# Patient Record
Sex: Female | Born: 1935 | Race: White | Hispanic: No | Smoking: Never smoker
Health system: Southern US, Community
[De-identification: ages and names within clinical notes are randomized; demographics above are authoritative.]

## PROBLEM LIST (undated history)

## (undated) DIAGNOSIS — I480 Paroxysmal atrial fibrillation: Secondary | ICD-10-CM

## (undated) DIAGNOSIS — I359 Nonrheumatic aortic valve disorder, unspecified: Secondary | ICD-10-CM

## (undated) DIAGNOSIS — H269 Unspecified cataract: Secondary | ICD-10-CM

## (undated) DIAGNOSIS — Z87828 Personal history of other (healed) physical injury and trauma: Secondary | ICD-10-CM

## (undated) DIAGNOSIS — H409 Unspecified glaucoma: Secondary | ICD-10-CM

## (undated) DIAGNOSIS — E119 Type 2 diabetes mellitus without complications: Secondary | ICD-10-CM

---

## 1998-08-31 ENCOUNTER — Encounter: Payer: Self-pay | Admitting: Emergency Medicine

## 1998-08-31 ENCOUNTER — Emergency Department (HOSPITAL_COMMUNITY): Admission: EM | Admit: 1998-08-31 | Discharge: 1998-08-31 | Payer: Self-pay | Admitting: Emergency Medicine

## 2001-05-22 ENCOUNTER — Encounter: Payer: Self-pay | Admitting: Internal Medicine

## 2001-05-22 ENCOUNTER — Ambulatory Visit (HOSPITAL_COMMUNITY): Admission: RE | Admit: 2001-05-22 | Discharge: 2001-05-22 | Payer: Self-pay | Admitting: Internal Medicine

## 2002-01-01 ENCOUNTER — Ambulatory Visit (HOSPITAL_COMMUNITY): Admission: RE | Admit: 2002-01-01 | Discharge: 2002-01-01 | Payer: Self-pay | Admitting: Internal Medicine

## 2002-01-01 ENCOUNTER — Encounter: Payer: Self-pay | Admitting: Internal Medicine

## 2002-01-05 ENCOUNTER — Encounter: Payer: Self-pay | Admitting: Internal Medicine

## 2002-01-05 ENCOUNTER — Ambulatory Visit (HOSPITAL_COMMUNITY): Admission: RE | Admit: 2002-01-05 | Discharge: 2002-01-05 | Payer: Self-pay | Admitting: Internal Medicine

## 2002-03-25 ENCOUNTER — Inpatient Hospital Stay (HOSPITAL_COMMUNITY): Admission: EM | Admit: 2002-03-25 | Discharge: 2002-03-26 | Payer: Self-pay | Admitting: Emergency Medicine

## 2002-03-25 ENCOUNTER — Encounter: Payer: Self-pay | Admitting: Emergency Medicine

## 2002-07-13 ENCOUNTER — Emergency Department (HOSPITAL_COMMUNITY): Admission: EM | Admit: 2002-07-13 | Discharge: 2002-07-13 | Payer: Self-pay | Admitting: Emergency Medicine

## 2002-07-13 ENCOUNTER — Encounter: Payer: Self-pay | Admitting: Emergency Medicine

## 2002-10-05 ENCOUNTER — Encounter: Payer: Self-pay | Admitting: Internal Medicine

## 2002-10-05 ENCOUNTER — Ambulatory Visit (HOSPITAL_COMMUNITY): Admission: RE | Admit: 2002-10-05 | Discharge: 2002-10-05 | Payer: Self-pay | Admitting: Internal Medicine

## 2002-10-14 ENCOUNTER — Emergency Department (HOSPITAL_COMMUNITY): Admission: EM | Admit: 2002-10-14 | Discharge: 2002-10-14 | Payer: Self-pay | Admitting: Emergency Medicine

## 2002-10-14 ENCOUNTER — Encounter: Payer: Self-pay | Admitting: *Deleted

## 2003-08-19 ENCOUNTER — Ambulatory Visit (HOSPITAL_COMMUNITY): Admission: RE | Admit: 2003-08-19 | Discharge: 2003-08-19 | Payer: Self-pay | Admitting: Family Medicine

## 2003-11-29 ENCOUNTER — Ambulatory Visit (HOSPITAL_COMMUNITY): Admission: RE | Admit: 2003-11-29 | Discharge: 2003-11-29 | Payer: Self-pay | Admitting: Pediatrics

## 2004-07-25 ENCOUNTER — Emergency Department (HOSPITAL_COMMUNITY): Admission: EM | Admit: 2004-07-25 | Discharge: 2004-07-25 | Payer: Self-pay | Admitting: Emergency Medicine

## 2004-11-08 ENCOUNTER — Ambulatory Visit (HOSPITAL_COMMUNITY): Admission: RE | Admit: 2004-11-08 | Discharge: 2004-11-08 | Payer: Self-pay | Admitting: Family Medicine

## 2011-04-02 DIAGNOSIS — Z87828 Personal history of other (healed) physical injury and trauma: Secondary | ICD-10-CM

## 2011-04-02 HISTORY — DX: Personal history of other (healed) physical injury and trauma: Z87.828

## 2011-07-14 ENCOUNTER — Encounter (HOSPITAL_COMMUNITY): Payer: Self-pay | Admitting: *Deleted

## 2011-07-14 ENCOUNTER — Emergency Department (HOSPITAL_COMMUNITY)
Admission: EM | Admit: 2011-07-14 | Discharge: 2011-07-14 | Disposition: A | Discharge status: Home / Self Care | Payer: Medicare Other | Attending: Emergency Medicine | Admitting: Emergency Medicine

## 2011-07-14 ENCOUNTER — Emergency Department (HOSPITAL_COMMUNITY): Payer: Medicare Other

## 2011-07-14 DIAGNOSIS — R002 Palpitations: Secondary | ICD-10-CM | POA: Insufficient documentation

## 2011-07-14 DIAGNOSIS — I10 Essential (primary) hypertension: Secondary | ICD-10-CM | POA: Insufficient documentation

## 2011-07-14 DIAGNOSIS — E119 Type 2 diabetes mellitus without complications: Secondary | ICD-10-CM | POA: Insufficient documentation

## 2011-07-14 DIAGNOSIS — I4891 Unspecified atrial fibrillation: Secondary | ICD-10-CM | POA: Insufficient documentation

## 2011-07-14 DIAGNOSIS — I480 Paroxysmal atrial fibrillation: Secondary | ICD-10-CM

## 2011-07-14 DIAGNOSIS — Z7901 Long term (current) use of anticoagulants: Secondary | ICD-10-CM | POA: Insufficient documentation

## 2011-07-14 DIAGNOSIS — R000 Tachycardia, unspecified: Secondary | ICD-10-CM | POA: Insufficient documentation

## 2011-07-14 DIAGNOSIS — Z79899 Other long term (current) drug therapy: Secondary | ICD-10-CM | POA: Insufficient documentation

## 2011-07-14 LAB — POCT I-STAT, CHEM 8
Chloride: 103 mEq/L (ref 96–112)
Creatinine, Ser: 0.9 mg/dL (ref 0.50–1.10)
Potassium: 4.1 mEq/L (ref 3.5–5.1)
Sodium: 137 mEq/L (ref 135–145)

## 2011-07-14 LAB — PROTIME-INR
INR: 1.53 — ABNORMAL HIGH (ref 0.00–1.49)
Prothrombin Time: 18.7 seconds — ABNORMAL HIGH (ref 11.6–15.2)

## 2011-07-14 LAB — URINALYSIS, ROUTINE W REFLEX MICROSCOPIC
Leukocytes, UA: NEGATIVE
Nitrite: NEGATIVE
Protein, ur: NEGATIVE mg/dL
Specific Gravity, Urine: <1.005 — ABNORMAL LOW (ref 1.005–1.030)
Urobilinogen, UA: 0.2 mg/dL (ref 0.0–1.0)

## 2011-07-14 LAB — CBC
HCT: 27.9 % — ABNORMAL LOW (ref 36.0–46.0)
Platelets: 109 10*3/uL — ABNORMAL LOW (ref 150–400)
RDW: 14.7 % (ref 11.5–15.5)

## 2011-07-14 LAB — POCT I-STAT TROPONIN I: Troponin i, poc: 0 ng/mL (ref 0.00–0.08)

## 2011-07-14 NOTE — ED Provider Notes (Signed)
History     CSN: [id number]  Arrival date & time 07/14/11  0056   First MD Initiated Contact with Patient 07/14/11 0123      Chief Complaint  Patient presents with  . Tachycardia    (Consider location/radiation/quality/duration/timing/severity/associated sxs/prior treatment) HPI History provided by patient. Chief complaint is  palpitations. Patient had gone to bed tonight and woke up with fluttering sensation in her chest. She has history of intermittent atrial fibrillation and has been instructed by her cardiologist to take an extra Lopressor if this occurs. She checked her heart rate noted to be in the range of 130-150.  She took an extra Lopressor and presented to the emergency apartment. She denies any chest pain or shortness of breath. No recent illness. She's been in normal state of health. She currently denies any symptoms.  states it feels like her fluttering has resolved. Moderate in severity. No known aggravating factors.   Past Medical History  Diagnosis Date  . Diabetes mellitus   . Atrial fibrillation   . Hypertension     History reviewed. No pertinent past surgical history.  History reviewed. No pertinent family history.  History  Substance Use Topics  . Smoking status: Never Smoker   . Smokeless tobacco: Not on file  . Alcohol Use: No    OB History    Grav Para Term Preterm Abortions TAB SAB Ect Mult Living                  Review of Systems  Constitutional: Negative for fever and chills.  HENT: Negative for neck pain and neck stiffness.   Eyes: Negative for pain.  Respiratory: Negative for shortness of breath.   Cardiovascular: Positive for palpitations. Negative for chest pain and leg swelling.  Gastrointestinal: Negative for abdominal pain.  Genitourinary: Negative for dysuria.  Musculoskeletal: Negative for back pain.  Skin: Negative for rash.  Neurological: Negative for headaches.  All other systems reviewed and are negative.    Allergies    Codeine and Sulfa antibiotics  Home Medications   Current Outpatient Rx  Name Route Sig Dispense Refill  . GLIMEPIRIDE 2 MG PO TABS Oral Take 2 mg by mouth daily before breakfast.    . LINAGLIPTIN 5 MG PO TABS Oral Take 5 mg by mouth daily.    Marland Kitchen LISINOPRIL-HYDROCHLOROTHIAZIDE 20-12.5 MG PO TABS Oral Take 1 tablet by mouth daily.    Marland Kitchen METFORMIN HCL 500 MG PO TABS Oral Take 500 mg by mouth 2 (two) times daily with a meal.    . METOPROLOL TARTRATE 25 MG PO TABS Oral Take 25 mg by mouth 2 (two) times daily.    . WARFARIN SODIUM 5 MG PO TABS Oral Take 5 mg by mouth daily.      BP 173/63  Pulse 81  Temp 98.2 F (36.8 C)  Resp 20  Ht 5\' 5"  (1.651 m)  Wt 215 lb (97.523 kg)  BMI 35.78 kg/m2  SpO2 100%  Physical Exam  Constitutional: She is oriented to person, place, and time. She appears well-developed and well-nourished.  HENT:  Head: Normocephalic and atraumatic.  Eyes: Conjunctivae and EOM are normal. Pupils are equal, round, and reactive to light.  Neck: Trachea normal. Neck supple. No thyromegaly present.  Cardiovascular: Normal rate, regular rhythm, S1 normal, S2 normal and normal pulses.     No systolic murmur is present   No diastolic murmur is present  Pulses:      Radial pulses are 2+ on the right  side, and 2+ on the left side.  Pulmonary/Chest: Effort normal and breath sounds normal. She has no wheezes. She has no rhonchi. She has no rales. She exhibits no tenderness.  Abdominal: Soft. Normal appearance and bowel sounds are normal. There is no tenderness. There is no CVA tenderness and negative Murphy's sign.  Musculoskeletal:       BLE:s Calves nontender, no cords or erythema, negative Homans sign  Neurological: She is alert and oriented to person, place, and time. She has normal strength. No cranial nerve deficit or sensory deficit. GCS eye subscore is 4. GCS verbal subscore is 5. GCS motor subscore is 6.  Skin: Skin is warm and dry. No rash noted. She is not  diaphoretic.  Psychiatric: Her speech is normal.       Cooperative and appropriate    ED Course  Procedures (including critical care time)  Labs Reviewed  CBC - Abnormal; Notable for the following:    WBC 3.4 (*)    RBC 3.33 (*)    Hemoglobin 9.0 (*)    HCT 27.9 (*)    Platelets 109 (*)    All other components within normal limits  PROTIME-INR - Abnormal; Notable for the following:    Prothrombin Time 18.7 (*)    INR 1.53 (*)    All other components within normal limits  POCT I-STAT, CHEM 8 - Abnormal; Notable for the following:    Glucose, Bld 203 (*)    Hemoglobin 9.2 (*)    HCT 27.0 (*)    All other components within normal limits  POCT I-STAT TROPONIN I  URINALYSIS, ROUTINE W REFLEX MICROSCOPIC    Date: 07/14/2011  Rate: 79  Rhythm: normal sinus rhythm  QRS Axis: normal  Intervals: QT prolonged  ST/T Wave abnormalities: nonspecific ST changes  Conduction Disutrbances: incomplete right bundle-branch block  Narrative Interpretation:   Old EKG Reviewed: none available  Asymptomatic in the ED. Normal sinus rhythm. Presentation suggest she was in atrial fibrillation prior to arrival. History of paroxysmal A. fib  Recheck at 4 AM is resting comfortably and remains without complaints. Stable for discharge home MDM   Palpitations with stated history of A. fib on Coumadin. Placed on monitor - is currently in normal sinus.   Mild anemia - is also discussed with patient and she states that she was recently told that she is mildly anemic by her Dr. Ivor Reining what her hemoglobin was. No black or tarry stools or active bleeding. No symptoms of anemia. Plan close followup for the same  INR 1.53 - subtherapeutic. Plan close primary care followup for medication management.I discussed this with patient and she tells me that her physician prefers her INR to be around 1.5.   Patient is no longer followed by cardiology, plan followup with Dr. Delbert Harness, primary care physician. Patient  stable for discharge home without indication for admit for further workup at this time.      Sunnie Nielsen, MD 07/14/11 [id number]

## 2011-07-14 NOTE — Discharge Instructions (Signed)
Atrial Fibrillation Your caregiver has diagnosed you with atrial fibrillation (AFib). The heart normally beats very regularly; AFib is a type of irregular heartbeat. The heart rate may be faster or slower than normal. This can prevent your heart from pumping as well as it should. AFib can be constant (chronic) or intermittent (paroxysmal). CAUSES  Atrial fibrillation may be caused by:  Heart disease, including heart attack, coronary artery disease, heart failure, diseases of the heart valves, and others.   Blood clot in the lungs (pulmonary embolism).   Pneumonia or other infections.   Chronic lung disease.   Thyroid disease.   Toxins. These include alcohol, some medications (such as decongestant medications or diet pills), and caffeine.  In some people, no cause for AFib can be found. This is referred to as Lone Atrial Fibrillation. SYMPTOMS   Palpitations or a fluttering in your chest.   A vague sense of chest discomfort.   Shortness of breath.   Sudden onset of lightheadedness or weakness.  Sometimes, the first sign of AFib can be a complication of the condition. This could be a stroke or heart failure. DIAGNOSIS  Your description of your condition may make your caregiver suspicious of atrial fibrillation. Your caregiver will examine your pulse to determine if fibrillation is present. An EKG (electrocardiogram) will confirm the diagnosis. Further testing may help determine what caused you to have atrial fibrillation. This may include chest x-ray, echocardiogram, blood tests, or CT scans. PREVENTION  If you have previously had atrial fibrillation, your caregiver may advise you to avoid substances known to cause the condition (such as stimulant medications, and possibly caffeine or alcohol). You may be advised to use medications to prevent recurrence. Proper treatment of any underlying condition is important to help prevent recurrence. PROGNOSIS  Atrial fibrillation does tend to  become a chronic condition over time. It can cause significant complications (see below). Atrial fibrillation is not usually immediately life-threatening, but it can shorten your life expectancy. This seems to be worse in women. If you have lone atrial fibrillation and are under 60 years old, the risk of complications is very low, and life expectancy is not shortened. RISKS AND COMPLICATIONS  Complications of atrial fibrillation can include stroke, chest pain, and heart failure. Your caregiver will recommend treatments for the atrial fibrillation, as well as for any underlying conditions, to help minimize risk of complications. TREATMENT  Treatment for AFib is divided into several categories:  Treatment of any underlying condition.   Converting you out of AFib into a regular (sinus) rhythm.   Controlling rapid heart rate.   Prevention of blood clots and stroke.  Medications and procedures are available to convert your atrial fibrillation to sinus rhythm. However, recent studies have shown that this may not offer you any advantage, and cardiac experts are continuing research and debate on this topic. More important is controlling your rapid heartbeat. The rapid heartbeat causes more symptoms, and places strain on your heart. Your caregiver will advise you on the use of medications that can control your heart rate. Atrial fibrillation is a strong stroke risk. You can lessen this risk by taking blood thinning medications such as Coumadin (warfarin), or sometimes aspirin. These medications need close monitoring by your caregiver. Over-medication can cause bleeding. Too little medication may not protect against stroke. HOME CARE INSTRUCTIONS   If your caregiver prescribed medicine to make your heartbeat more normally, take as directed.   If blood thinners were prescribed by your caregiver, take EXACTLY as directed.     Perform blood tests EXACTLY as directed.   Quit smoking. Smoking increases your  cardiac and lung (pulmonary) risks.   DO NOT drink alcohol.   DO NOT drink caffeinated drinks (e.g. coffee, soda, chocolate, and leaf teas). You may drink decaffeinated coffee, soda or tea.   If you are overweight, you should choose a reduced calorie diet to lose weight. Please see a registered dietitian if you need more information about healthy weight loss. DO NOT USE DIET PILLS as they may aggravate heart problems.   If you have other heart problems that are causing AFib, you may need to eat a low salt, fat, and cholesterol diet. Your caregiver will tell you if this is necessary.   Exercise every day to improve your physical fitness. Stay active unless advised otherwise.   If your caregiver has given you a follow-up appointment, it is very important to keep that appointment. Not keeping the appointment could result in heart failure or stroke. If there is any problem keeping the appointment, you must call back to this facility for assistance.  SEEK MEDICAL CARE IF:  You notice a change in the rate, rhythm or strength of your heartbeat.   You develop an infection or any other change in your overall health status.  SEEK IMMEDIATE MEDICAL CARE IF:   You develop chest pain, abdominal pain, sweating, weakness or feel sick to your stomach (nausea).   You develop shortness of breath.   You develop swollen feet and ankles.   You develop dizziness, numbness, or weakness of your face or limbs, or any change in vision or speech.  MAKE SURE YOU:   Understand these instructions.   Will watch your condition.   Will get help right away if you are not doing well or get worse.    Continue your medications as prescribed. Return here for any worsening condition.

## 2011-07-14 NOTE — ED Notes (Addendum)
Pt reports being awoken up about an hour ago with rapid heart rate.  States that reports she checked, rate ranged 130-150.  Pt reports she did take an extra dose of Metoprolol.  Presently rate controlled and regular, but more rapid than normal.  Pt does report feeling light headed and weak at present.

## 2011-08-02 ENCOUNTER — Ambulatory Visit (HOSPITAL_COMMUNITY)
Admission: RE | Admit: 2011-08-02 | Discharge: 2011-08-02 | Disposition: A | Discharge status: Home / Self Care | Payer: Medicare Other | Source: Ambulatory Visit | Attending: Family Medicine | Admitting: Family Medicine

## 2011-08-02 ENCOUNTER — Other Ambulatory Visit (HOSPITAL_COMMUNITY): Payer: Self-pay | Admitting: Family Medicine

## 2011-08-02 DIAGNOSIS — R29890 Loss of height: Secondary | ICD-10-CM | POA: Insufficient documentation

## 2011-08-02 DIAGNOSIS — M1990 Unspecified osteoarthritis, unspecified site: Secondary | ICD-10-CM

## 2011-08-02 DIAGNOSIS — R5381 Other malaise: Secondary | ICD-10-CM | POA: Insufficient documentation

## 2011-08-02 DIAGNOSIS — M545 Low back pain, unspecified: Secondary | ICD-10-CM | POA: Insufficient documentation

## 2011-12-13 ENCOUNTER — Emergency Department (HOSPITAL_COMMUNITY): Payer: No Typology Code available for payment source

## 2011-12-13 ENCOUNTER — Inpatient Hospital Stay (HOSPITAL_COMMUNITY)
Admission: EM | Admit: 2011-12-13 | Discharge: 2011-12-20 | DRG: 394 | Disposition: A | Discharge status: Home / Self Care | Payer: No Typology Code available for payment source | Attending: General Surgery | Admitting: General Surgery

## 2011-12-13 ENCOUNTER — Encounter (HOSPITAL_COMMUNITY): Payer: Self-pay

## 2011-12-13 DIAGNOSIS — S2249XA Multiple fractures of ribs, unspecified side, initial encounter for closed fracture: Secondary | ICD-10-CM | POA: Diagnosis present

## 2011-12-13 DIAGNOSIS — S36509A Unspecified injury of unspecified part of colon, initial encounter: Secondary | ICD-10-CM

## 2011-12-13 DIAGNOSIS — D649 Anemia, unspecified: Secondary | ICD-10-CM | POA: Diagnosis present

## 2011-12-13 DIAGNOSIS — S40019A Contusion of unspecified shoulder, initial encounter: Secondary | ICD-10-CM | POA: Diagnosis present

## 2011-12-13 DIAGNOSIS — Z880 Allergy status to penicillin: Secondary | ICD-10-CM

## 2011-12-13 DIAGNOSIS — Y998 Other external cause status: Secondary | ICD-10-CM

## 2011-12-13 DIAGNOSIS — I10 Essential (primary) hypertension: Secondary | ICD-10-CM | POA: Diagnosis present

## 2011-12-13 DIAGNOSIS — S8000XA Contusion of unspecified knee, initial encounter: Secondary | ICD-10-CM | POA: Diagnosis present

## 2011-12-13 DIAGNOSIS — S36500A Unspecified injury of ascending [right] colon, initial encounter: Principal | ICD-10-CM | POA: Diagnosis present

## 2011-12-13 DIAGNOSIS — I4891 Unspecified atrial fibrillation: Secondary | ICD-10-CM | POA: Diagnosis present

## 2011-12-13 DIAGNOSIS — Y92410 Unspecified street and highway as the place of occurrence of the external cause: Secondary | ICD-10-CM

## 2011-12-13 DIAGNOSIS — S2241XA Multiple fractures of ribs, right side, initial encounter for closed fracture: Secondary | ICD-10-CM

## 2011-12-13 DIAGNOSIS — Z7901 Long term (current) use of anticoagulants: Secondary | ICD-10-CM

## 2011-12-13 DIAGNOSIS — S301XXA Contusion of abdominal wall, initial encounter: Secondary | ICD-10-CM

## 2011-12-13 DIAGNOSIS — S36520A Contusion of ascending [right] colon, initial encounter: Secondary | ICD-10-CM | POA: Diagnosis present

## 2011-12-13 DIAGNOSIS — T07XXXA Unspecified multiple injuries, initial encounter: Secondary | ICD-10-CM | POA: Diagnosis present

## 2011-12-13 DIAGNOSIS — S20219A Contusion of unspecified front wall of thorax, initial encounter: Secondary | ICD-10-CM

## 2011-12-13 DIAGNOSIS — E119 Type 2 diabetes mellitus without complications: Secondary | ICD-10-CM | POA: Diagnosis present

## 2011-12-13 DIAGNOSIS — Z882 Allergy status to sulfonamides status: Secondary | ICD-10-CM

## 2011-12-13 LAB — CBC WITH DIFFERENTIAL/PLATELET
Basophils Relative: 0 % (ref 0–1)
Eosinophils Absolute: 0 10*3/uL (ref 0.0–0.7)
HCT: 28.4 % — ABNORMAL LOW (ref 36.0–46.0)
Hemoglobin: 9.7 g/dL — ABNORMAL LOW (ref 12.0–15.0)
MCH: 30.9 pg (ref 26.0–34.0)
MCHC: 34.2 g/dL (ref 30.0–36.0)
Monocytes Absolute: 0.7 10*3/uL (ref 0.1–1.0)
Neutro Abs: 8.9 10*3/uL — ABNORMAL HIGH (ref 1.7–7.7)

## 2011-12-13 LAB — COMPREHENSIVE METABOLIC PANEL
BUN: 15 mg/dL (ref 6–23)
Calcium: 8.8 mg/dL (ref 8.4–10.5)
GFR calc Af Amer: 79 mL/min — ABNORMAL LOW (ref >90)
Glucose, Bld: 199 mg/dL — ABNORMAL HIGH (ref 70–99)
Total Protein: 5.7 g/dL — ABNORMAL LOW (ref 6.0–8.3)

## 2011-12-13 LAB — GLUCOSE, CAPILLARY: Glucose-Capillary: 182 mg/dL — ABNORMAL HIGH (ref 70–99)

## 2011-12-13 LAB — POCT I-STAT, CHEM 8
Glucose, Bld: 183 mg/dL — ABNORMAL HIGH (ref 70–99)
HCT: 30 % — ABNORMAL LOW (ref 36.0–46.0)
Hemoglobin: 10.2 g/dL — ABNORMAL LOW (ref 12.0–15.0)
Potassium: 3.5 mEq/L (ref 3.5–5.1)
TCO2: 23 mmol/L (ref 0–100)

## 2011-12-13 MED ORDER — GLIMEPIRIDE 2 MG PO TABS
2.0000 mg | ORAL_TABLET | Freq: Every day | ORAL | Status: DC
  Administered 2011-12-14: 2 mg via ORAL
  Filled 2011-12-13 (×2): qty 1

## 2011-12-13 MED ORDER — ONDANSETRON HCL 4 MG/2ML IJ SOLN: 4.0000 mg | Freq: Four times a day (QID) | INTRAMUSCULAR | Status: DC | PRN

## 2011-12-13 MED ORDER — LISINOPRIL-HYDROCHLOROTHIAZIDE 20-12.5 MG PO TABS
1.0000 | ORAL_TABLET | Freq: Every day | ORAL | Status: DC
Start: 2011-12-14 — End: 2011-12-14

## 2011-12-13 MED ORDER — ACETAMINOPHEN 325 MG PO TABS: 650.0000 mg | ORAL_TABLET | ORAL | Status: DC | PRN

## 2011-12-13 MED ORDER — FENTANYL CITRATE 0.05 MG/ML IJ SOLN
50.0000 ug | Freq: Once | INTRAMUSCULAR | Status: AC
  Administered 2011-12-13: 50 ug via INTRAVENOUS
  Filled 2011-12-13: qty 2

## 2011-12-13 MED ORDER — METFORMIN HCL 500 MG PO TABS
500.0000 mg | ORAL_TABLET | Freq: Two times a day (BID) | ORAL | Status: DC
  Filled 2011-12-13 (×3): qty 1

## 2011-12-13 MED ORDER — INSULIN ASPART 100 UNIT/ML ~~LOC~~ SOLN: 0.0000 [IU] | Freq: Every day | SUBCUTANEOUS | Status: DC

## 2011-12-13 MED ORDER — VITAMIN D3 25 MCG (1000 UNIT) PO TABS
1000.0000 [IU] | ORAL_TABLET | Freq: Every day | ORAL | Status: DC
  Administered 2011-12-14 – 2011-12-20 (×7): 1000 [IU] via ORAL
  Filled 2011-12-13 (×7): qty 1

## 2011-12-13 MED ORDER — HYDROMORPHONE HCL 2 MG PO TABS
2.0000 mg | ORAL_TABLET | ORAL | Status: DC | PRN
  Administered 2011-12-17: 2 mg via ORAL
  Filled 2011-12-13: qty 1

## 2011-12-13 MED ORDER — IPRATROPIUM-ALBUTEROL 18-103 MCG/ACT IN AERO
2.0000 | INHALATION_SPRAY | Freq: Four times a day (QID) | RESPIRATORY_TRACT | Status: DC
  Filled 2011-12-13: qty 14.7

## 2011-12-13 MED ORDER — INSULIN ASPART 100 UNIT/ML ~~LOC~~ SOLN
0.0000 [IU] | Freq: Three times a day (TID) | SUBCUTANEOUS | Status: DC
  Administered 2011-12-14: 3 [IU] via SUBCUTANEOUS
  Administered 2011-12-14: 2 [IU] via SUBCUTANEOUS
  Administered 2011-12-14: 3 [IU] via SUBCUTANEOUS

## 2011-12-13 MED ORDER — SODIUM CHLORIDE 0.9 % IV SOLN
INTRAVENOUS | Status: DC
  Administered 2011-12-13: 14:00:00 via INTRAVENOUS

## 2011-12-13 MED ORDER — PANTOPRAZOLE SODIUM 40 MG PO TBEC
40.0000 mg | DELAYED_RELEASE_TABLET | Freq: Every day | ORAL | Status: DC
  Administered 2011-12-14 – 2011-12-20 (×7): 40 mg via ORAL
  Filled 2011-12-13 (×7): qty 1

## 2011-12-13 MED ORDER — PANTOPRAZOLE SODIUM 40 MG IV SOLR
40.0000 mg | Freq: Every day | INTRAVENOUS | Status: DC
  Filled 2011-12-13 (×7): qty 40

## 2011-12-13 MED ORDER — ONDANSETRON HCL 4 MG/2ML IJ SOLN
4.0000 mg | Freq: Once | INTRAMUSCULAR | Status: AC
  Administered 2011-12-13: 4 mg via INTRAVENOUS
  Filled 2011-12-13: qty 2

## 2011-12-13 MED ORDER — METOPROLOL TARTRATE 25 MG PO TABS
25.0000 mg | ORAL_TABLET | Freq: Two times a day (BID) | ORAL | Status: DC
  Administered 2011-12-14 – 2011-12-20 (×14): 25 mg via ORAL
  Filled 2011-12-13 (×16): qty 1

## 2011-12-13 MED ORDER — IOHEXOL 300 MG/ML  SOLN
100.0000 mL | Freq: Once | INTRAMUSCULAR | Status: AC | PRN
  Administered 2011-12-13: 100 mL via INTRAVENOUS

## 2011-12-13 MED ORDER — MORPHINE SULFATE 2 MG/ML IJ SOLN
2.0000 mg | INTRAMUSCULAR | Status: DC | PRN
  Administered 2011-12-14 – 2011-12-16 (×9): 2 mg via INTRAVENOUS
  Filled 2011-12-13 (×10): qty 1

## 2011-12-13 MED ORDER — ONDANSETRON HCL 4 MG PO TABS: 4.0000 mg | ORAL_TABLET | Freq: Four times a day (QID) | ORAL | Status: DC | PRN

## 2011-12-13 MED ORDER — LINAGLIPTIN 5 MG PO TABS
5.0000 mg | ORAL_TABLET | Freq: Every day | ORAL | Status: DC
  Administered 2011-12-14 – 2011-12-20 (×7): 5 mg via ORAL
  Filled 2011-12-13 (×7): qty 1

## 2011-12-13 MED ORDER — POTASSIUM CHLORIDE IN NACL 20-0.45 MEQ/L-% IV SOLN
INTRAVENOUS | Status: DC
  Administered 2011-12-14: 1000 mL via INTRAVENOUS
  Administered 2011-12-14 – 2011-12-19 (×7): via INTRAVENOUS
  Filled 2011-12-13 (×13): qty 1000

## 2011-12-13 MED ORDER — SODIUM CHLORIDE 0.9 % IV BOLUS (SEPSIS)
500.0000 mL | Freq: Once | INTRAVENOUS | Status: AC
  Administered 2011-12-13: 500 mL via INTRAVENOUS

## 2011-12-13 NOTE — ED Notes (Signed)
Per [facility] link pt is 4th in line for transport. Family and pt made aware.
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
## 2011-12-13 NOTE — ED Notes (Signed)
Pt c/o feeling nauseated. Pt states, " I think Im going to throw up" medicated for nausea- see MAR

## 2011-12-13 NOTE — ED Notes (Signed)
Dr. Adriana Simas in and made family and pt aware of being transferred to [facility]. Pt and family agree to transfer. Pt resting in bed- nad noted. Waiting for [facility] to transfer pt to [facility].
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
## 2011-12-14 ENCOUNTER — Inpatient Hospital Stay (HOSPITAL_COMMUNITY): Payer: No Typology Code available for payment source

## 2011-12-14 DIAGNOSIS — S2241XA Multiple fractures of ribs, right side, initial encounter for closed fracture: Secondary | ICD-10-CM | POA: Diagnosis present

## 2011-12-14 DIAGNOSIS — S36520A Contusion of ascending [right] colon, initial encounter: Secondary | ICD-10-CM | POA: Diagnosis present

## 2011-12-14 DIAGNOSIS — Z7901 Long term (current) use of anticoagulants: Secondary | ICD-10-CM

## 2011-12-14 DIAGNOSIS — T07XXXA Unspecified multiple injuries, initial encounter: Secondary | ICD-10-CM | POA: Diagnosis present

## 2011-12-14 DIAGNOSIS — E119 Type 2 diabetes mellitus without complications: Secondary | ICD-10-CM | POA: Diagnosis present

## 2011-12-14 DIAGNOSIS — D649 Anemia, unspecified: Secondary | ICD-10-CM | POA: Diagnosis present

## 2011-12-14 DIAGNOSIS — I4891 Unspecified atrial fibrillation: Secondary | ICD-10-CM | POA: Diagnosis present

## 2011-12-14 LAB — CBC
MCH: 30.4 pg (ref 26.0–34.0)
Platelets: 113 10*3/uL — ABNORMAL LOW (ref 150–400)
RBC: 2.8 MIL/uL — ABNORMAL LOW (ref 3.87–5.11)
WBC: 6.3 10*3/uL (ref 4.0–10.5)

## 2011-12-14 LAB — URINALYSIS, ROUTINE W REFLEX MICROSCOPIC
Ketones, ur: NEGATIVE mg/dL
Nitrite: NEGATIVE
Specific Gravity, Urine: 1.015 (ref 1.005–1.030)
pH: 5 (ref 5.0–8.0)

## 2011-12-14 LAB — GLUCOSE, CAPILLARY: Glucose-Capillary: 152 mg/dL — ABNORMAL HIGH (ref 70–99)

## 2011-12-14 LAB — PROTIME-INR
INR: 2.12 — ABNORMAL HIGH (ref 0.00–1.49)
Prothrombin Time: 24.1 seconds — ABNORMAL HIGH (ref 11.6–15.2)

## 2011-12-14 LAB — BASIC METABOLIC PANEL
CO2: 25 mEq/L (ref 19–32)
Calcium: 8.6 mg/dL (ref 8.4–10.5)
Sodium: 133 mEq/L — ABNORMAL LOW (ref 135–145)

## 2011-12-14 LAB — URINE MICROSCOPIC-ADD ON

## 2011-12-14 MED ORDER — IPRATROPIUM-ALBUTEROL 18-103 MCG/ACT IN AERO
2.0000 | INHALATION_SPRAY | Freq: Four times a day (QID) | RESPIRATORY_TRACT | Status: DC
  Administered 2011-12-14 – 2011-12-17 (×11): 2 via RESPIRATORY_TRACT
  Filled 2011-12-14: qty 14.7

## 2011-12-14 MED ORDER — LISINOPRIL 20 MG PO TABS
20.0000 mg | ORAL_TABLET | Freq: Every day | ORAL | Status: DC
  Administered 2011-12-14 – 2011-12-20 (×7): 20 mg via ORAL
  Filled 2011-12-14 (×8): qty 1

## 2011-12-14 MED ORDER — GLIMEPIRIDE 2 MG PO TABS
2.0000 mg | ORAL_TABLET | Freq: Every day | ORAL | Status: DC
  Administered 2011-12-15 – 2011-12-20 (×6): 2 mg via ORAL
  Filled 2011-12-14 (×7): qty 1

## 2011-12-14 MED ORDER — INSULIN ASPART 100 UNIT/ML ~~LOC~~ SOLN
0.0000 [IU] | Freq: Three times a day (TID) | SUBCUTANEOUS | Status: DC
  Administered 2011-12-15: 3 [IU] via SUBCUTANEOUS
  Administered 2011-12-15: 2 [IU] via SUBCUTANEOUS
  Administered 2011-12-15: 3 [IU] via SUBCUTANEOUS
  Administered 2011-12-16: 2 [IU] via SUBCUTANEOUS
  Administered 2011-12-16 – 2011-12-17 (×3): 5 [IU] via SUBCUTANEOUS
  Administered 2011-12-18: 2 [IU] via SUBCUTANEOUS
  Administered 2011-12-18: 3 [IU] via SUBCUTANEOUS
  Administered 2011-12-18 – 2011-12-19 (×4): 2 [IU] via SUBCUTANEOUS
  Administered 2011-12-20: 3 [IU] via SUBCUTANEOUS

## 2011-12-14 MED ORDER — INFLUENZA VIRUS VACC SPLIT PF IM SUSP: 0.5000 mL | INTRAMUSCULAR | Status: DC

## 2011-12-14 MED ORDER — INFLUENZA VIRUS VACC SPLIT PF IM SUSP
0.5000 mL | INTRAMUSCULAR | Status: AC
  Administered 2011-12-16: 0.5 mL via INTRAMUSCULAR
  Filled 2011-12-14: qty 0.5

## 2011-12-14 MED ORDER — METFORMIN HCL 500 MG PO TABS
500.0000 mg | ORAL_TABLET | Freq: Two times a day (BID) | ORAL | Status: DC
  Administered 2011-12-15 – 2011-12-20 (×10): 500 mg via ORAL
  Filled 2011-12-14 (×13): qty 1

## 2011-12-14 MED ORDER — DOCUSATE SODIUM 100 MG PO CAPS
100.0000 mg | ORAL_CAPSULE | Freq: Two times a day (BID) | ORAL | Status: DC
  Administered 2011-12-14 – 2011-12-20 (×13): 100 mg via ORAL
  Filled 2011-12-14 (×12): qty 1

## 2011-12-14 MED ORDER — BIOTENE DRY MOUTH MT LIQD
15.0000 mL | Freq: Two times a day (BID) | OROMUCOSAL | Status: DC
  Administered 2011-12-14 – 2011-12-20 (×11): 15 mL via OROMUCOSAL

## 2011-12-14 MED ORDER — HYDROCHLOROTHIAZIDE 12.5 MG PO CAPS
12.5000 mg | ORAL_CAPSULE | Freq: Every day | ORAL | Status: DC
  Administered 2011-12-14 – 2011-12-20 (×7): 12.5 mg via ORAL
  Filled 2011-12-14 (×8): qty 1
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
## 2011-12-15 ENCOUNTER — Inpatient Hospital Stay (HOSPITAL_COMMUNITY): Payer: No Typology Code available for payment source

## 2011-12-15 LAB — GLUCOSE, CAPILLARY
Glucose-Capillary: 132 mg/dL — ABNORMAL HIGH (ref 70–99)
Glucose-Capillary: 167 mg/dL — ABNORMAL HIGH (ref 70–99)

## 2011-12-15 LAB — BASIC METABOLIC PANEL
CO2: 25 mEq/L (ref 19–32)
Calcium: 8.6 mg/dL (ref 8.4–10.5)
Creatinine, Ser: 0.7 mg/dL (ref 0.50–1.10)
GFR calc non Af Amer: 80 mL/min — ABNORMAL LOW (ref >90)
Glucose, Bld: 127 mg/dL — ABNORMAL HIGH (ref 70–99)

## 2011-12-15 LAB — CBC WITH DIFFERENTIAL/PLATELET
Basophils Absolute: 0 10*3/uL (ref 0.0–0.1)
Basophils Relative: 0 % (ref 0–1)
Eosinophils Absolute: 0.2 10*3/uL (ref 0.0–0.7)
Eosinophils Relative: 3 % (ref 0–5)
MCH: 30.4 pg (ref 26.0–34.0)
MCV: 89.8 fL (ref 78.0–100.0)
Platelets: 93 10*3/uL — ABNORMAL LOW (ref 150–400)
RDW: 13.9 % (ref 11.5–15.5)

## 2011-12-15 LAB — PROTIME-INR: Prothrombin Time: 22.8 seconds — ABNORMAL HIGH (ref 11.6–15.2)
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
## 2011-12-15 NOTE — Progress Notes (Signed)
Pt transported to radiology for x-ray.  IV, no O2 or tele (has been here longer than 24 hours).

## 2011-12-16 ENCOUNTER — Inpatient Hospital Stay (HOSPITAL_COMMUNITY): Payer: No Typology Code available for payment source

## 2011-12-16 DIAGNOSIS — S8000XA Contusion of unspecified knee, initial encounter: Secondary | ICD-10-CM

## 2011-12-16 DIAGNOSIS — S2239XA Fracture of one rib, unspecified side, initial encounter for closed fracture: Secondary | ICD-10-CM

## 2011-12-16 LAB — PROTIME-INR
INR: 1.62 — ABNORMAL HIGH (ref 0.00–1.49)
Prothrombin Time: 19.5 seconds — ABNORMAL HIGH (ref 11.6–15.2)

## 2011-12-16 LAB — GLUCOSE, CAPILLARY: Glucose-Capillary: 138 mg/dL — ABNORMAL HIGH (ref 70–99)

## 2011-12-16 MED ORDER — TRAMADOL HCL 50 MG PO TABS
50.0000 mg | ORAL_TABLET | Freq: Four times a day (QID) | ORAL | Status: DC | PRN
  Administered 2011-12-16: 100 mg via ORAL
  Administered 2011-12-18: 50 mg via ORAL
  Administered 2011-12-19 – 2011-12-20 (×2): 100 mg via ORAL
  Filled 2011-12-16 (×2): qty 2
  Filled 2011-12-16: qty 1
  Filled 2011-12-16: qty 2
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
## 2011-12-16 NOTE — Progress Notes (Signed)
Occupational Therapy Treatment Patient Details Name: Tracey Heath MRN: [id number] DOB: Aug 22, 1929 Today's Date: 12/16/2011 Time: 4098-1191 OT Time Calculation (min): 27 min  OT Assessment / Plan / Recommendation Comments on Treatment Session Pt. tolerated session well, demonstrating an understanding of AE for use of LE's. Pt. stated her pan level was a 6/10 but that she was able to continue with treatment    Follow Up Recommendations  Home health OT;Supervision - Intermittent;Supervision/Assistance - 24 hour    Barriers to Discharge       Equipment Recommendations  Defer to next venue    Recommendations for Other Services    Frequency Min 2X/week   Plan Discharge plan remains appropriate    Precautions / Restrictions Precautions Precautions: Fall Restrictions Weight Bearing Restrictions: No   Pertinent Vitals/Pain Pt. Stated pain 6/10    ADL  Grooming: Performed;Wash/dry hands;Wash/dry face;Teeth care;Min guard Where Assessed - Grooming: Supported standing Upper Body Bathing: Performed;Min guard Where Assessed - Upper Body Bathing: Supported standing Lower Body Bathing: Performed;Modified independent Where Assessed - Lower Body Bathing: Supported sitting Upper Body Dressing: Simulated;Minimal assistance Where Assessed - Upper Body Dressing: Unsupported sitting Lower Body Dressing: Performed;Modified independent Where Assessed - Lower Body Dressing: Supported sitting Toilet Transfer: Performed;Min guard (Cues to sit down slowly and hand placement) Toilet Transfer Method: Sit to stand Toilet Transfer Equipment: Raised toilet seat with arms (or 3-in-1 over toilet) Toileting - Clothing Manipulation and Hygiene: Performed;Minimal assistance Where Assessed - Engineer, mining and Hygiene: Sit to stand from 3-in-1 or toilet Equipment Used: Gait belt;Reacher;Sock aid Transfers/Ambulation Related to ADLs: Min A for ambulation in room ADL Comments: Pt.  demonstrated use of AE (reacher and sock aid) for lower body bathing and dressing while seated at the sink     OT Diagnosis:    OT Problem List:   OT Treatment Interventions:     OT Goals Acute Rehab OT Goals OT Goal Formulation: With patient Time For Goal Achievement: 12/22/11 Potential to Achieve Goals: Good ADL Goals Pt Will Perform Grooming: with supervision;Standing at sink ADL Goal: Grooming - Progress: Progressing toward goals Pt Will Perform Upper Body Bathing: with set-up;Sitting, chair ADL Goal: Upper Body Bathing - Progress: Progressing toward goals Pt Will Perform Lower Body Bathing: with supervision;Sit to stand from chair;Sit to stand from bed ADL Goal: Lower Body Bathing - Progress: Progressing toward goals Pt Will Perform Upper Body Dressing: with set-up;Unsupported;Sitting, bed;Sitting, chair ADL Goal: Upper Body Dressing - Progress: Progressing toward goals Pt Will Perform Lower Body Dressing: with supervision;with adaptive equipment;Sit to stand from bed;Sit to stand from chair ADL Goal: Lower Body Dressing - Progress: Progressing toward goals Pt Will Transfer to Toilet: with supervision;Ambulation;Comfort height toilet;with DME ADL Goal: Toilet Transfer - Progress: Progressing toward goals Pt Will Perform Toileting - Clothing Manipulation: with supervision;Standing ADL Goal: Toileting - Clothing Manipulation - Progress: Progressing toward goals Pt Will Perform Toileting - Hygiene: with supervision;Standing at 3-in-1/toilet Pt Will Perform Tub/Shower Transfer: with supervision;Ambulation Additional ADL Goal #1: Pt. will be modified independent with bed mobilty in prep for ADLs ADL Goal: Additional Goal #1 - Progress: Progressing toward goals  Visit Information  Last OT Received On: 12/16/11 Assistance Needed: +1    Subjective Data      Prior Functioning       Cognition  Overall Cognitive Status: Appears within functional limits for tasks  assessed/performed Arousal/Alertness: Awake/alert Orientation Level: Appears intact for tasks assessed Behavior During Session: Flat affect    Mobility  Bed Mobility  Bed Mobility: Supine to Sit;Sitting - Scoot to Edge of Bed Supine to Sit: 4: Min assist;With rails;HOB elevated Sitting - Scoot to Edge of Bed: 5: Supervision Sit to Supine: 3: Mod assist;HOB flat;With rail Details for Bed Mobility Assistance: Pt. required assistance with getting bilat. LE's into bed. Required increased time for supine to sit due to pain in ribs Transfers Transfers: Sit to Stand;Stand to Sit Sit to Stand: 4: Min assist;With upper extremity assist;From bed;From chair/3-in-1 Stand to Sit: 4: Min guard;To bed;To chair/3-in-1 Details for Transfer Assistance: Pt. required cueing for safe hand placement and to transfer slowly when sitting down.              Balance Balance Balance Assessed: No   End of Session OT - End of Session Equipment Utilized During Treatment: Gait belt Activity Tolerance: Patient limited by pain Patient left: in bed;with call bell/phone within reach;with family/visitor present  GO     Cleora Fleet 12/16/2011, 2:55 PM

## 2011-12-16 NOTE — Progress Notes (Signed)
Clinical Social Work Department BRIEF PSYCHOSOCIAL ASSESSMENT 12/16/2011  Patient:  Tracey Heath, Tracey Heath     Account Number:  [id number]     Admit date:  12/13/2011  Clinical Social Worker:  Dennison Bulla  Date/Time:  12/16/2011 03:30 PM  Referred by:  Physician  Date Referred:  12/16/2011 Referred for  SNF Placement   Other Referral:   Interview type:  Patient Other interview type:   Son present    PSYCHOSOCIAL DATA Living Status:  ALONE Admitted from facility:   Level of care:   Primary support name:  Aneta Mins Primary support relationship to patient:  CHILD, ADULT Degree of support available:   Strong    CURRENT CONCERNS Current Concerns  Post-Acute Placement   Other Concerns:    SOCIAL WORK ASSESSMENT / PLAN CSW received referral to assist with dc planning. CSW reviewed chart and met with patient at bedside. Son present. Patient agreeable to son being involved in assessment.    CSW introduced myself and explained role. Patient was living alone PTA and child and grandchildren will check on her throughout the day. CSW discussed PT recommendations for 24 hour supervision vs SNF. Patient reports she will be unable to have 24 hour care and feels SNF will be best option. CSW explained SNF process and provided patient with SNF list. CSW explained copays with insurance. CSW received permission to fax out to [facility] and [facility] CSW encouraged patient to review list and have alternative options in case these facilities were unable to extend a bed offer.    CSW submitted for pasarr, completed FL2 and faxed out. Patient reports no MH hx so level 1 pasarr was received. Patient denies any SA. CSW will follow up with bed offers.   Assessment/plan status:  Psychosocial Support/Ongoing Assessment of Needs Other assessment/ plan:   SBIRT   Information/referral to community resources:   SNF list.    PATIENT'S/FAMILY'S RESPONSE TO PLAN OF CARE: Patient alert and oriented.  Patient agreeable to son being involved in assessment. Patient engaged throughout assessment.        Coverage for [employer]

## 2011-12-16 NOTE — Progress Notes (Signed)
Rehab admissions - Evaluated for possible admission.  Please see rehab MD consult done today by Dr. Riley Kill recommending SNF.  Patient is doing well with therapies and lacks the medical necessity to justify an inpatient rehab admission.  Call me for questions.  #[phone]
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
## 2011-12-16 NOTE — Progress Notes (Signed)
I believe that inpt Rehab would be good consideration.  This patient has been seen and I agree with the findings and treatment plan.  Marta Lamas. Gae Bon, MD, FACS [phone] (pager) [phone] (direct pager) Trauma Surgeon

## 2011-12-16 NOTE — Progress Notes (Signed)
I agree with the following treatment note after reviewing documentation.   Johnston, Mannat Benedetti Brynn   OTR/L Pager: [phone] Office: [phone] .   
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
## 2011-12-17 LAB — PROTIME-INR
INR: 1.48 (ref 0.00–1.49)
Prothrombin Time: 18.2 seconds — ABNORMAL HIGH (ref 11.6–15.2)

## 2011-12-17 LAB — CBC
HCT: 24.7 % — ABNORMAL LOW (ref 36.0–46.0)
HCT: 26.1 % — ABNORMAL LOW (ref 36.0–46.0)
Hemoglobin: 8.4 g/dL — ABNORMAL LOW (ref 12.0–15.0)
Hemoglobin: 8.8 g/dL — ABNORMAL LOW (ref 12.0–15.0)
MCH: 30.1 pg (ref 26.0–34.0)
MCH: 30 pg (ref 26.0–34.0)
MCHC: 33.7 g/dL (ref 30.0–36.0)
MCV: 88.5 fL (ref 78.0–100.0)
MCV: 89.1 fL (ref 78.0–100.0)
Platelets: 113 10*3/uL — ABNORMAL LOW (ref 150–400)
RBC: 2.79 MIL/uL — ABNORMAL LOW (ref 3.87–5.11)
RBC: 2.93 MIL/uL — ABNORMAL LOW (ref 3.87–5.11)
RDW: 14 % (ref 11.5–15.5)
WBC: 6.5 10*3/uL (ref 4.0–10.5)

## 2011-12-17 LAB — GLUCOSE, CAPILLARY
Glucose-Capillary: 75 mg/dL (ref 70–99)
Glucose-Capillary: 223 mg/dL — ABNORMAL HIGH (ref 70–99)
Glucose-Capillary: 220 mg/dL — ABNORMAL HIGH (ref 70–99)
Glucose-Capillary: 151 mg/dL — ABNORMAL HIGH (ref 70–99)

## 2011-12-17 MED ORDER — ENSURE COMPLETE PO LIQD
237.0000 mL | Freq: Three times a day (TID) | ORAL | Status: DC
  Administered 2011-12-17 – 2011-12-20 (×8): 237 mL via ORAL

## 2011-12-17 MED ORDER — WARFARIN SODIUM 7.5 MG PO TABS
7.5000 mg | ORAL_TABLET | Freq: Once | ORAL | Status: AC
  Administered 2011-12-17: 7.5 mg via ORAL
  Filled 2011-12-17: qty 1

## 2011-12-17 MED ORDER — WARFARIN - PHARMACIST DOSING INPATIENT
Freq: Every day | Status: DC
  Administered 2011-12-17 – 2011-12-19 (×2)

## 2011-12-17 MED ORDER — IPRATROPIUM-ALBUTEROL 18-103 MCG/ACT IN AERO
2.0000 | INHALATION_SPRAY | RESPIRATORY_TRACT | Status: DC | PRN
  Filled 2011-12-17: qty 14.7

## 2011-12-17 NOTE — Progress Notes (Signed)
Physical Therapy Treatment Patient Details Name: Tracey Heath MRN: [id number] DOB: [birth year] Today's Date: 12/17/2011 Time: 4098-1191 PT Time Calculation (min): 20 min  PT Assessment / Plan / Recommendation Comments on Treatment Session  Patient had just gotten back to bed but agreeable to ambulation. Recommended use of RW this session and patient appeared more unstable with use of just IV pole. Continue to increase mobilization    Follow Up Recommendations  Skilled nursing facility;Supervision/Assistance - 24 hour    Barriers to Discharge        Equipment Recommendations  Defer to next venue    Recommendations for Other Services    Frequency Min 3X/week   Plan Discharge plan remains appropriate;Frequency remains appropriate    Precautions / Restrictions Precautions Precautions: Fall   Pertinent Vitals/Pain     Mobility  Bed Mobility Supine to Sit: 4: Min assist;HOB flat Sit to Supine: HOB flat;4: Min assist Details for Bed Mobility Assistance: A for shoulder support out of bed and A to elevate LEs back into bed.  Transfers Sit to Stand: 4: Min assist;With upper extremity assist;From bed;From toilet Stand to Sit: 4: Min guard;With upper extremity assist;To toilet;To bed Details for Transfer Assistance: A for stabilization and to ensure safety  and cues for safe hand placement Ambulation/Gait Ambulation/Gait Assistance: 4: Min assist Ambulation Distance (Feet): 120 Feet Assistive device: Rolling walker Ambulation/Gait Assistance Details: Cues for posture and use of RW.  Gait Pattern: Step-through pattern;Decreased stride length;Trunk flexed Gait velocity: decreased    Exercises     PT Diagnosis:    PT Problem List:   PT Treatment Interventions:     PT Goals Acute Rehab PT Goals PT Goal: Supine/Side to Sit - Progress: Progressing toward goal PT Goal: Sit to Stand - Progress: Progressing toward goal PT Transfer Goal: Bed to Chair/Chair to Bed - Progress:  Progressing toward goal PT Goal: Ambulate - Progress: Progressing toward goal  Visit Information  Last PT Received On: 12/17/11 Assistance Needed: +1    Subjective Data      Cognition  Overall Cognitive Status: Appears within functional limits for tasks assessed/performed Arousal/Alertness: Awake/alert Orientation Level: Appears intact for tasks assessed Behavior During Session: [facility] for tasks performed    Balance     End of Session PT - End of Session Equipment Utilized During Treatment: Gait belt Activity Tolerance: Patient tolerated treatment well Patient left: in chair;with call bell/phone within reach Nurse Communication: Mobility status   GP     Fredrich Birks 12/17/2011, 2:55 PM 12/17/2011 Fredrich Birks PTA [phone] pager [phone] office

## 2011-12-17 NOTE — Progress Notes (Signed)
Patient ID: Tracey Heath, female   DOB: [birth year], 78 y.o.   MRN: [id number] Agree with Cliffton Asters, PA note.   Encourage PO Ambulating with PT Coumadin restarted
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
## 2011-12-17 NOTE — Progress Notes (Signed)
UR completed 

## 2011-12-18 LAB — PROTIME-INR: INR: 1.57 — ABNORMAL HIGH (ref 0.00–1.49)

## 2011-12-18 MED ORDER — POLYETHYLENE GLYCOL 3350 17 G PO PACK
17.0000 g | PACK | Freq: Every day | ORAL | Status: DC
  Administered 2011-12-18 – 2011-12-20 (×3): 17 g via ORAL
  Filled 2011-12-18 (×3): qty 1

## 2011-12-18 MED ORDER — WARFARIN SODIUM 7.5 MG PO TABS
7.5000 mg | ORAL_TABLET | Freq: Once | ORAL | Status: AC
  Administered 2011-12-18: 7.5 mg via ORAL
  Filled 2011-12-18 (×2): qty 1

## 2011-12-18 MED ORDER — BISACODYL 5 MG PO TBEC: 10.0000 mg | DELAYED_RELEASE_TABLET | Freq: Every day | ORAL | Status: DC | PRN
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
## 2011-12-18 NOTE — Clinical Social Work Note (Signed)
Clinical Social Worker continuing to follow patient for emotional support and discharge planning needs.  Patient remains hopeful for placement in [place] area to stay close to family but is willing to expand search to facilities in [facility].  Patient son present at the bedside and understands the process and is agreeable to expand search.  CSW has left numerous messages with [facility] at [facility] regarding possible bed offer - awaiting response.  Per PA, patient may be ready for discharge 09/19 if diet is tolerated.  Clinical Social Worker will continue to be available for emotional support and to facilitate patient discharge needs once medically stable.  Macario Golds, [place] [phone]
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
## 2011-12-19 LAB — GLUCOSE, CAPILLARY
Glucose-Capillary: 193 mg/dL — ABNORMAL HIGH (ref 70–99)
Glucose-Capillary: 127 mg/dL — ABNORMAL HIGH (ref 70–99)
Glucose-Capillary: 146 mg/dL — ABNORMAL HIGH (ref 70–99)
Glucose-Capillary: 138 mg/dL — ABNORMAL HIGH (ref 70–99)

## 2011-12-19 MED ORDER — DSS 100 MG PO CAPS: 100.0000 mg | ORAL_CAPSULE | Freq: Two times a day (BID) | ORAL | Status: DC | PRN

## 2011-12-19 MED ORDER — TRAMADOL HCL 50 MG PO TABS: 50.0000 mg | ORAL_TABLET | Freq: Four times a day (QID) | ORAL | Status: DC | PRN

## 2011-12-19 MED ORDER — IPRATROPIUM-ALBUTEROL 18-103 MCG/ACT IN AERO: 2.0000 | INHALATION_SPRAY | RESPIRATORY_TRACT | Status: AC | PRN

## 2011-12-19 MED ORDER — POLYETHYLENE GLYCOL 3350 17 G PO PACK: 17.0000 g | PACK | Freq: Every day | ORAL | Status: DC

## 2011-12-19 MED ORDER — WARFARIN SODIUM 7.5 MG PO TABS
7.5000 mg | ORAL_TABLET | Freq: Once | ORAL | Status: AC
  Administered 2011-12-19: 7.5 mg via ORAL
  Filled 2011-12-19 (×2): qty 1

## 2011-12-19 NOTE — Discharge Summary (Signed)
Seen examined and agree.  
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
## 2011-12-19 NOTE — Progress Notes (Signed)
Pt discussed in hospital LOS meeting today. 
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
## 2011-12-20 LAB — COMPREHENSIVE METABOLIC PANEL
Alkaline Phosphatase: 53 U/L (ref 39–117)
BUN: 15 mg/dL (ref 6–23)
CO2: 26 mEq/L (ref 19–32)
Chloride: 99 mEq/L (ref 96–112)
Creatinine, Ser: 0.75 mg/dL (ref 0.50–1.10)
GFR calc non Af Amer: 78 mL/min — ABNORMAL LOW (ref >90)
Potassium: 4.7 mEq/L (ref 3.5–5.1)
Total Bilirubin: 1.8 mg/dL — ABNORMAL HIGH (ref 0.3–1.2)

## 2011-12-20 LAB — CBC
HCT: 24.9 % — ABNORMAL LOW (ref 36.0–46.0)
Hemoglobin: 8.3 g/dL — ABNORMAL LOW (ref 12.0–15.0)
MCV: 90.9 fL (ref 78.0–100.0)
RBC: 2.74 MIL/uL — ABNORMAL LOW (ref 3.87–5.11)
WBC: 4.7 10*3/uL (ref 4.0–10.5)

## 2011-12-20 LAB — PROTIME-INR: INR: 1.7 — ABNORMAL HIGH (ref 0.00–1.49)

## 2011-12-20 LAB — GLUCOSE, CAPILLARY: Glucose-Capillary: 177 mg/dL — ABNORMAL HIGH (ref 70–99)

## 2011-12-20 MED ORDER — WARFARIN SODIUM 7.5 MG PO TABS
7.5000 mg | ORAL_TABLET | Freq: Once | ORAL | Status: DC
  Filled 2011-12-20: qty 1
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
## 2011-12-20 NOTE — Progress Notes (Signed)
I saw the patient, participated in the history, exam and medical decision making, and concur with the physician assistant's note above.  Alert,sitting in chair, NAD. Appropriate CTA, reg Soft  Care plan as below  Mary Sella. Andrey Campanile, MD, FACS General, Bariatric, & Minimally Invasive Surgery [facility], [place]
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
## 2011-12-20 NOTE — Progress Notes (Signed)
UR completed 

## 2011-12-20 NOTE — Progress Notes (Signed)
Pt's son Aneta Mins has agreed to take pt home and arrange for 24hr care among family members. HHPT/OT with [facility] per pt choice and insurance network. Address and phone number in system are correct.

## 2012-03-27 ENCOUNTER — Other Ambulatory Visit (HOSPITAL_COMMUNITY): Payer: Self-pay | Admitting: Family Medicine

## 2012-03-27 DIAGNOSIS — Z139 Encounter for screening, unspecified: Secondary | ICD-10-CM

## 2012-04-09 ENCOUNTER — Ambulatory Visit (HOSPITAL_COMMUNITY)
Admission: RE | Admit: 2012-04-09 | Discharge: 2012-04-09 | Disposition: A | Discharge status: Home / Self Care | Payer: Medicare Other | Source: Ambulatory Visit | Attending: Family Medicine | Admitting: Family Medicine

## 2012-04-09 DIAGNOSIS — Z139 Encounter for screening, unspecified: Secondary | ICD-10-CM

## 2012-04-09 DIAGNOSIS — Z1231 Encounter for screening mammogram for malignant neoplasm of breast: Secondary | ICD-10-CM | POA: Insufficient documentation

## 2012-04-10 ENCOUNTER — Ambulatory Visit (HOSPITAL_COMMUNITY): Payer: Medicare Other

## 2012-04-14 ENCOUNTER — Other Ambulatory Visit: Payer: Self-pay | Admitting: Family Medicine

## 2012-04-14 DIAGNOSIS — R928 Other abnormal and inconclusive findings on diagnostic imaging of breast: Secondary | ICD-10-CM

## 2012-04-22 ENCOUNTER — Encounter (HOSPITAL_COMMUNITY): Payer: Medicare Other

## 2012-04-29 ENCOUNTER — Encounter (HOSPITAL_COMMUNITY): Payer: Medicare Other

## 2012-05-06 ENCOUNTER — Encounter (HOSPITAL_COMMUNITY): Payer: Medicare Other

## 2012-05-13 ENCOUNTER — Encounter (HOSPITAL_COMMUNITY): Payer: Medicare Other

## 2012-05-13 ENCOUNTER — Ambulatory Visit (HOSPITAL_COMMUNITY)
Admission: RE | Admit: 2012-05-13 | Discharge: 2012-05-13 | Disposition: A | Discharge status: Home / Self Care | Payer: Medicare Other | Source: Ambulatory Visit | Attending: Family Medicine | Admitting: Family Medicine

## 2012-05-13 DIAGNOSIS — Z1231 Encounter for screening mammogram for malignant neoplasm of breast: Secondary | ICD-10-CM | POA: Insufficient documentation

## 2012-05-13 DIAGNOSIS — R928 Other abnormal and inconclusive findings on diagnostic imaging of breast: Secondary | ICD-10-CM

## 2012-05-20 ENCOUNTER — Encounter (HOSPITAL_COMMUNITY): Payer: Medicare Other

## 2013-02-22 NOTE — Discharge Summary (Signed)
Haylie Mccutcheon M. Demeco Ducksworth, MD, FACS General, Bariatric, & Minimally Invasive Surgery [facility] [place]  

## 2014-06-06 ENCOUNTER — Emergency Department (HOSPITAL_COMMUNITY): Payer: Medicare HMO

## 2014-06-06 ENCOUNTER — Inpatient Hospital Stay (HOSPITAL_COMMUNITY)
Admission: EM | Admit: 2014-06-06 | Discharge: 2014-06-10 | DRG: 194 | Disposition: A | Discharge status: Skilled Nursing Facility | Payer: Medicare HMO | Attending: Family Medicine | Admitting: Family Medicine

## 2014-06-06 ENCOUNTER — Encounter (HOSPITAL_COMMUNITY): Payer: Self-pay | Admitting: Emergency Medicine

## 2014-06-06 DIAGNOSIS — R296 Repeated falls: Secondary | ICD-10-CM

## 2014-06-06 DIAGNOSIS — J189 Pneumonia, unspecified organism: Secondary | ICD-10-CM | POA: Diagnosis not present

## 2014-06-06 DIAGNOSIS — G249 Dystonia, unspecified: Secondary | ICD-10-CM | POA: Diagnosis present

## 2014-06-06 DIAGNOSIS — Z880 Allergy status to penicillin: Secondary | ICD-10-CM

## 2014-06-06 DIAGNOSIS — M25552 Pain in left hip: Secondary | ICD-10-CM

## 2014-06-06 DIAGNOSIS — I350 Nonrheumatic aortic (valve) stenosis: Secondary | ICD-10-CM | POA: Diagnosis present

## 2014-06-06 DIAGNOSIS — I482 Chronic atrial fibrillation: Secondary | ICD-10-CM | POA: Diagnosis present

## 2014-06-06 DIAGNOSIS — R627 Adult failure to thrive: Secondary | ICD-10-CM | POA: Diagnosis present

## 2014-06-06 DIAGNOSIS — R011 Cardiac murmur, unspecified: Secondary | ICD-10-CM | POA: Diagnosis present

## 2014-06-06 DIAGNOSIS — D649 Anemia, unspecified: Secondary | ICD-10-CM | POA: Diagnosis present

## 2014-06-06 DIAGNOSIS — H409 Unspecified glaucoma: Secondary | ICD-10-CM | POA: Diagnosis present

## 2014-06-06 DIAGNOSIS — D61818 Other pancytopenia: Secondary | ICD-10-CM | POA: Diagnosis present

## 2014-06-06 DIAGNOSIS — I509 Heart failure, unspecified: Secondary | ICD-10-CM | POA: Diagnosis present

## 2014-06-06 DIAGNOSIS — Z882 Allergy status to sulfonamides status: Secondary | ICD-10-CM

## 2014-06-06 DIAGNOSIS — I4891 Unspecified atrial fibrillation: Secondary | ICD-10-CM

## 2014-06-06 DIAGNOSIS — D509 Iron deficiency anemia, unspecified: Secondary | ICD-10-CM

## 2014-06-06 DIAGNOSIS — I480 Paroxysmal atrial fibrillation: Secondary | ICD-10-CM

## 2014-06-06 DIAGNOSIS — Z7982 Long term (current) use of aspirin: Secondary | ICD-10-CM

## 2014-06-06 DIAGNOSIS — Z885 Allergy status to narcotic agent status: Secondary | ICD-10-CM

## 2014-06-06 DIAGNOSIS — Z7901 Long term (current) use of anticoagulants: Secondary | ICD-10-CM

## 2014-06-06 DIAGNOSIS — D696 Thrombocytopenia, unspecified: Secondary | ICD-10-CM

## 2014-06-06 DIAGNOSIS — R17 Unspecified jaundice: Secondary | ICD-10-CM | POA: Diagnosis present

## 2014-06-06 DIAGNOSIS — N289 Disorder of kidney and ureter, unspecified: Secondary | ICD-10-CM | POA: Diagnosis present

## 2014-06-06 DIAGNOSIS — E46 Unspecified protein-calorie malnutrition: Secondary | ICD-10-CM | POA: Diagnosis present

## 2014-06-06 DIAGNOSIS — R531 Weakness: Secondary | ICD-10-CM | POA: Diagnosis not present

## 2014-06-06 DIAGNOSIS — Z7983 Long term (current) use of bisphosphonates: Secondary | ICD-10-CM

## 2014-06-06 DIAGNOSIS — E119 Type 2 diabetes mellitus without complications: Secondary | ICD-10-CM | POA: Diagnosis present

## 2014-06-06 DIAGNOSIS — I959 Hypotension, unspecified: Secondary | ICD-10-CM | POA: Diagnosis present

## 2014-06-06 HISTORY — DX: Unspecified glaucoma: H40.9

## 2014-06-06 HISTORY — DX: Unspecified cataract: H26.9

## 2014-06-06 HISTORY — DX: Paroxysmal atrial fibrillation: I48.0

## 2014-06-06 HISTORY — DX: Nonrheumatic aortic valve disorder, unspecified: I35.9

## 2014-06-06 HISTORY — DX: Personal history of other (healed) physical injury and trauma: Z87.828

## 2014-06-06 HISTORY — DX: Type 2 diabetes mellitus without complications: E11.9

## 2014-06-06 LAB — CBC
HCT: 27.4 % — ABNORMAL LOW (ref 36.0–46.0)
Hemoglobin: 8.9 g/dL — ABNORMAL LOW (ref 12.0–15.0)
MCH: 31.8 pg (ref 26.0–34.0)
MCHC: 32.5 g/dL (ref 30.0–36.0)
MCV: 97.9 fL (ref 78.0–100.0)
PLATELETS: 115 10*3/uL — AB (ref 150–400)
RBC: 2.8 MIL/uL — AB (ref 3.87–5.11)
RDW: 17.2 % — ABNORMAL HIGH (ref 11.5–15.5)
WBC: 5.8 10*3/uL (ref 4.0–10.5)

## 2014-06-06 LAB — URINALYSIS, ROUTINE W REFLEX MICROSCOPIC
Glucose, UA: NEGATIVE mg/dL
Hgb urine dipstick: NEGATIVE
Ketones, ur: NEGATIVE mg/dL
LEUKOCYTES UA: NEGATIVE
NITRITE: NEGATIVE
Protein, ur: NEGATIVE mg/dL
SPECIFIC GRAVITY, URINE: 1.02 (ref 1.005–1.030)
Urobilinogen, UA: 0.2 mg/dL (ref 0.0–1.0)
pH: 5.5 (ref 5.0–8.0)

## 2014-06-06 LAB — BASIC METABOLIC PANEL
Anion gap: 9 (ref 5–15)
BUN: 35 mg/dL — AB (ref 6–23)
CALCIUM: 8.8 mg/dL (ref 8.4–10.5)
CO2: 20 mmol/L (ref 19–32)
CREATININE: 1.57 mg/dL — AB (ref 0.50–1.10)
Chloride: 109 mmol/L (ref 96–112)
GFR, EST AFRICAN AMERICAN: 34 mL/min — AB (ref >90)
GFR, EST NON AFRICAN AMERICAN: 30 mL/min — AB (ref >90)
Glucose, Bld: 157 mg/dL — ABNORMAL HIGH (ref 70–99)
POTASSIUM: 4.5 mmol/L (ref 3.5–5.1)
SODIUM: 138 mmol/L (ref 135–145)

## 2014-06-06 LAB — HEPATIC FUNCTION PANEL
ALK PHOS: 49 U/L (ref 39–117)
ALT: 18 U/L (ref 0–35)
AST: 29 U/L (ref 0–37)
Albumin: 2.6 g/dL — ABNORMAL LOW (ref 3.5–5.2)
BILIRUBIN DIRECT: 0.6 mg/dL — AB (ref 0.0–0.5)
BILIRUBIN INDIRECT: 1.3 mg/dL — AB (ref 0.3–0.9)
Total Bilirubin: 1.9 mg/dL — ABNORMAL HIGH (ref 0.3–1.2)
Total Protein: 6.4 g/dL (ref 6.0–8.3)

## 2014-06-06 LAB — BRAIN NATRIURETIC PEPTIDE: B NATRIURETIC PEPTIDE 5: 623 pg/mL — AB (ref 0.0–100.0)

## 2014-06-06 LAB — TROPONIN I

## 2014-06-06 LAB — PROTIME-INR
INR: 2.64 — ABNORMAL HIGH (ref 0.00–1.49)
Prothrombin Time: 28.4 seconds — ABNORMAL HIGH (ref 11.6–15.2)

## 2014-06-06 LAB — TSH: TSH: 4.197 u[IU]/mL (ref 0.350–4.500)

## 2014-06-06 LAB — LIPASE, BLOOD: Lipase: 25 U/L (ref 11–59)

## 2014-06-06 LAB — LACTIC ACID, PLASMA: Lactic Acid, Venous: 2.5 mmol/L (ref 0.5–2.0)

## 2014-06-06 MED ORDER — LEVOTHYROXINE SODIUM 50 MCG PO TABS
50.0000 ug | ORAL_TABLET | Freq: Every day | ORAL | Status: DC
  Administered 2014-06-07 – 2014-06-10 (×4): 50 ug via ORAL
  Filled 2014-06-06 (×4): qty 1

## 2014-06-06 MED ORDER — WARFARIN - PHARMACIST DOSING INPATIENT: Freq: Every day | Status: DC

## 2014-06-06 MED ORDER — LEVOFLOXACIN IN D5W 750 MG/150ML IV SOLN
750.0000 mg | Freq: Once | INTRAVENOUS | Status: AC
  Administered 2014-06-06: 750 mg via INTRAVENOUS
  Filled 2014-06-06: qty 150

## 2014-06-06 MED ORDER — WARFARIN SODIUM 5 MG PO TABS
2.5000 mg | ORAL_TABLET | Freq: Once | ORAL | Status: AC
  Administered 2014-06-06: 2.5 mg via ORAL
  Filled 2014-06-06: qty 1

## 2014-06-06 MED ORDER — SODIUM CHLORIDE 0.9 % IV BOLUS (SEPSIS)
250.0000 mL | Freq: Once | INTRAVENOUS | Status: AC
  Administered 2014-06-06: 250 mL via INTRAVENOUS

## 2014-06-06 MED ORDER — METOPROLOL TARTRATE 25 MG PO TABS
25.0000 mg | ORAL_TABLET | Freq: Two times a day (BID) | ORAL | Status: DC
  Administered 2014-06-06 – 2014-06-10 (×8): 25 mg via ORAL
  Filled 2014-06-06 (×8): qty 1

## 2014-06-06 MED ORDER — LEVOFLOXACIN IN D5W 750 MG/150ML IV SOLN: 750.0000 mg | INTRAVENOUS | Status: DC

## 2014-06-06 MED ORDER — SODIUM CHLORIDE 0.9 % IV SOLN: INTRAVENOUS | Status: DC

## 2014-06-06 MED ORDER — SODIUM CHLORIDE 0.9 % IV SOLN
INTRAVENOUS | Status: DC
  Administered 2014-06-06: 22:00:00 via INTRAVENOUS

## 2014-06-06 MED ORDER — SODIUM CHLORIDE 0.9 % IV SOLN
INTRAVENOUS | Status: DC
  Administered 2014-06-06: 16:00:00 via INTRAVENOUS

## 2014-06-06 NOTE — H&P (Signed)
PCP:   Isabella StallingNDIEGO,Tracey M, MD   Chief Complaint: weak   HPI: 81 yo female h/o afib, dm comes in from pcp office for several falls and progressive generalized weakness for a month.  Pt reports she has had such a decline and doesn't know why.  She has been sob for a month, no fevers.  Denies coughing.  Denies any orthopnea or pnd.  Has been swelling in her legs more so than normal.  No n/v/d.  She lives alone.  No dysuria or urinary symptoms.  No rashes.  No cp, abd pain.  No pain in legs.  She was sent here to ED for evaluation of her weakness.  Found to have pna on cxr.  Denies any focal weakness/n/t anywhere.  Review of Systems:  Positive and negative as per HPI otherwise all other systems are negative  Past Medical History: Past Medical History  Diagnosis Date  . Diabetes mellitus   . Atrial fibrillation   . Hypotension   . Glaucoma   . Cataract    History reviewed. No pertinent past surgical history.  Medications: Prior to Admission medications   Medication Sig Start Date End Date Taking? Authorizing Provider  albuterol-ipratropium (COMBIVENT) 18-103 MCG/ACT inhaler Inhale 2 puffs into the lungs every 4 (four) hours as needed for wheezing. 12/19/11  Yes Elizabeth A White, PA-C  alendronate (FOSAMAX) 70 MG tablet Take 70 mg by mouth once a week. Take with a full glass of water on an empty stomach.   Yes Historical Provider, MD  aspirin EC 81 MG tablet Take 81 mg by mouth daily.   Yes Historical Provider, MD  cholecalciferol (VITAMIN D) 1000 UNITS tablet Take 2,000 Units by mouth daily.    Yes Historical Provider, MD  glimepiride (AMARYL) 2 MG tablet Take 2 mg by mouth daily.    Yes Historical Provider, MD  ketorolac (ACULAR) 0.4 % SOLN Apply 1 drop to eye 4 (four) times daily. PRIOR TO SURGERY 05/26/14  Yes Historical Provider, MD  levothyroxine (SYNTHROID, LEVOTHROID) 50 MCG tablet Take 50 mcg by mouth daily before breakfast.   Yes Historical Provider, MD  metFORMIN (GLUCOPHAGE)  500 MG tablet Take 500 mg by mouth 2 (two) times daily with a meal.   Yes Historical Provider, MD  metoprolol tartrate (LOPRESSOR) 25 MG tablet Take 25 mg by mouth 2 (two) times daily.   Yes Historical Provider, MD  Multiple Vitamins-Minerals (ICAPS) TABS Take 1 tablet by mouth daily.   Yes Historical Provider, MD  ofloxacin (OCUFLOX) 0.3 % ophthalmic solution Apply 1 drop to eye 4 (four) times daily. PRIOR TO SURGERY 05/24/14  Yes Historical Provider, MD  Omega-3 Fatty Acids (FISH OIL) 1200 MG CAPS Take 1 capsule by mouth daily.   Yes Historical Provider, MD  OMEGA-3 KRILL OIL PO Take 1 capsule by mouth daily.   Yes Historical Provider, MD  prednisoLONE acetate (PRED FORTE) 1 % ophthalmic suspension Apply 1 drop to eye 4 (four) times daily. AFTER SURGERY 05/24/14  Yes Historical Provider, MD  warfarin (COUMADIN) 2.5 MG tablet Take by mouth every evening.    Yes Historical Provider, MD    Allergies:   Allergies  Allergen Reactions  . Codeine Hives and Shortness Of Breath  . Aleve [Naproxen Sodium]     Swelling in feet  . Hydrocodone   . Oxycodone   . Shellfish Allergy Swelling  . Tetanus Toxoids     Allergic to shellfish   . Penicillins Rash  . Sulfa Antibiotics Rash  Social History:  reports that she has never smoked. She does not have any smokeless tobacco history on file. She reports that she does not drink alcohol or use illicit drugs.  Family History: History reviewed. No pertinent family history.  Physical Exam: Filed Vitals:   06/06/14 1730 06/06/14 1822 06/06/14 1830 06/06/14 2000  BP: 110/60 132/50  109/61  Pulse: 56 61    Temp:  97.5 F (36.4 C)    TempSrc:  Oral    Resp: Height:      Weight:      SpO2: 100% 100% 100%    General appearance: alert, cooperative and no distress Head: Normocephalic, without obvious abnormality, atraumatic Eyes: negative Nose: Nares normal. Septum midline. Mucosa normal. No drainage or sinus tenderness. Neck: no JVD  and supple, symmetrical, trachea midline Lungs: clear to auscultation bilaterally Heart: regular rate and rhythm, S1, S2 normal and systolic murmur: early systolic 4/6, blowing at 2nd left intercostal space Abdomen: soft, non-tender; bowel sounds normal; no masses,  no organomegaly Extremities: edema 2+ Pulses: 2+ and symmetric Skin: Skin color, texture, turgor normal. No rashes or lesions Neurologic: Grossly normal    Labs on Admission:   Recent Labs  06/06/14 1213  NA 138  K 4.5  CL 109  CO2 20  GLUCOSE 157*  BUN 35*  CREATININE 1.57*  CALCIUM 8.8    Recent Labs  06/06/14 1213  AST 29  ALT 18  ALKPHOS 49  BILITOT 1.9*  PROT 6.4  ALBUMIN 2.6*    Recent Labs  06/06/14 1213  LIPASE 25    Recent Labs  06/06/14 1213  WBC 5.8  HGB 8.9*  HCT 27.4*  MCV 97.9  PLT 115*    Recent Labs  06/06/14 1213  TROPONINI <0.03   Radiological Exams on Admission: Dg Chest 1 View  06/06/2014   CLINICAL DATA:  Status post fall 3 times over the weekend  EXAM: CHEST  1 VIEW  COMPARISON:  December 16, 2011  FINDINGS: The mediastinal contour is normal. The heart size is probably enlarged. There is patchy consolidation of medial left lung base. There is no pulmonary edema or pleural effusion. There is chronic deformity of the lateral right fourth rib. There is deformity of the lateral right fifth rib, acute fracture is not excluded if the patient has focal pain here. There is no pneumothorax.  IMPRESSION: Patchy consolidation of medial left lung base suspicious for pneumonia.  Deformity of the lateral right fifth rib, acute fracture is not excluded if the patient has focal pain here.   Electronically Signed   By: Sherian Rein Heath.D.   On: 06/06/2014 17:20   Ct Head Wo Contrast  06/06/2014   CLINICAL DATA:  81 year old female with weakness  EXAM: CT HEAD WITHOUT CONTRAST  TECHNIQUE: Contiguous axial images were obtained from the base of the skull through the vertex without intravenous  contrast.  COMPARISON:  Prior CT scan of the head and cervical spine 12/13/2011  FINDINGS: Negative for acute intracranial hemorrhage, acute infarction, mass, mass effect, hydrocephalus or midline shift. Gray-white differentiation is preserved throughout. Relatively mild cortical atrophy for age. Mild periventricular and and subcortical white matter hypoattenuation consistent with chronic microvascular ischemic white matter disease is also similar in unchanged compared to prior. No focal soft tissue or calvarial abnormality. Bilateral globes and orbits are symmetric bilaterally. Normal aeration of the mastoid air cells and visualized paranasal sinuses. Atherosclerotic calcification in both cavernous carotid arteries. Hyperostosis frontalis interna  noted incidentally. Stable small lucency in the right parieto-occipital calvarium dating back to 2013 and therefore likely benign.  IMPRESSION: 1. No acute intracranial abnormality. 2. Stable mild atrophy and chronic microvascular ischemic white matter disease.   Electronically Signed   By: Malachy Moan Heath.D.   On: 06/06/2014 16:54   Dg Hips Bilat With Pelvis 3-4 Views  06/06/2014   CLINICAL DATA:  Multiple falls.  Bilateral hip tenderness.  EXAM: BILATERAL HIP (WITH PELVIS) 3-4 VIEWS  COMPARISON:  None.  FINDINGS: Mild symmetric degenerative changes in the hips bilaterally. No acute bony abnormality. Specifically, no fracture, subluxation, or dislocation. Soft tissues are intact.  IMPRESSION: No acute bony abnormality.   Electronically Signed   By: Charlett Nose Heath.D.   On: 06/06/2014 16:59    Assessment/Plan  81 yo female with FTT for last month of unclear etiology  Principal Problem:   CAP (community acquired pneumonia)-  May be more than pna due to her lab abnormalities.  Treat with levaquin.  Further w/u below.  pna pathway.  Have also added on bnp.  All vitals are normal.    Active Problems:   Atrial fibrillation- controlled   Chronic anticoagulation-  coumadin per pharmacy   Anemia-  Anemia panel ordered, no overt bleeding   Frequent falls   Generalized weakness   Renal insufficiency, mild-  Do not have baseline cr level.   Thrombocytopenia   Undiagnosed cardiac murmurs-  Aortic stenosis??  Order 2 D echo in am.     Elevated bilirubin-  With hypoalbumenia, elev bili will also order abdominal u/s  pcp is dr Janna Arch, may have more insight on previous evaluation of her renal function, and murmur.  obs on medical.  Full code.  Samani Deal A 06/06/2014, 9:01 PM

## 2014-06-06 NOTE — ED Notes (Signed)
Pts family reports that they wanted to let the doctor know that pt has had several periods of not being able to stand on her own this week and that she has not been able to walk unassisted and seems more forgetful than normal.

## 2014-06-06 NOTE — ED Notes (Addendum)
Pt reports weakness x1 month. Pt sent here from Dr. Otilio Saberondiego's office. Pt alert and oriented.

## 2014-06-06 NOTE — ED Notes (Signed)
Pt states that she has not been feeling well for about 2 weeks and recently moved in with her son because she is unable to take care of herself at home.  States he is sick right now and cannot take care of her and her pcp told her to come here for eval.  Denies any pain, n/v, or complaints other than being tired.

## 2014-06-06 NOTE — Progress Notes (Signed)
CRITICAL VALUE ALERT  Critical value received:  Lactic Acid 2.5  Date of notification:  06/06/14  Time of notification:  22:33  Critical value read back: yes  Nurse who received alert:  Felecia JanSheena Tommy Goostree, RN  MD notified (1st page):  Dr. Clearence PedSchorr  Time of first page:  22:33  MD notified (2nd page): Dr. Clearence PedSchorr  Time of second page: 22:52  Responding MD:  Dr. Clearence PedSchorr  Time MD responded:  23:16

## 2014-06-06 NOTE — Progress Notes (Signed)
ANTIBIOTIC CONSULT NOTE - INITIAL  Pharmacy Consult for Levaquin (renal adjustment) Indication: pneumonia  Allergies  Allergen Reactions  . Codeine Hives and Shortness Of Breath  . Aleve [Naproxen Sodium]     Swelling in feet  . Hydrocodone   . Oxycodone   . Shellfish Allergy Swelling  . Tetanus Toxoids     Allergic to shellfish   . Penicillins Rash  . Sulfa Antibiotics Rash    Patient Measurements: Height:  (172.7 cm) Weight: 192 lb (87.091 kg) IBW/kg (Calculated) : 63.9 Adjusted Body Weight:   Vital Signs: Temp: 98 F (36.7 C) (03/07 2117) Temp Source: Oral (03/07 2117) BP: 129/46 mmHg (03/07 2117) Pulse Rate: 66 (03/07 2117) Intake/Output from previous day:   Intake/Output from this shift:    Labs:  Recent Labs  06/06/14 1213  WBC 5.8  HGB 8.9*  PLT 115*  CREATININE 1.57*   Estimated Creatinine Clearance: 31.9 mL/min (by C-G formula based on Cr of 1.57). No results for input(s): VANCOTROUGH, VANCOPEAK, VANCORANDOM, GENTTROUGH, GENTPEAK, GENTRANDOM, TOBRATROUGH, TOBRAPEAK, TOBRARND, AMIKACINPEAK, AMIKACINTROU, AMIKACIN in the last 72 hours.   Microbiology: No results found for this or any previous visit (from the past 720 hour(s)).  Medical History: Past Medical History  Diagnosis Date  . Diabetes mellitus   . Atrial fibrillation   . Hypotension   . Glaucoma   . Cataract     Medications:  Prescriptions prior to admission  Medication Sig Dispense Refill Last Dose  . albuterol-ipratropium (COMBIVENT) 18-103 MCG/ACT inhaler Inhale 2 puffs into the lungs every 4 (four) hours as needed for wheezing. 1 Inhaler 0   . alendronate (FOSAMAX) 70 MG tablet Take 70 mg by mouth once a week. Take with a full glass of water on an empty stomach.   Past Week at Unknown time  . aspirin EC 81 MG tablet Take 81 mg by mouth daily.   06/06/2014 at Unknown time  . cholecalciferol (VITAMIN D) 1000 UNITS tablet Take 2,000 Units by mouth daily.    06/06/2014 at Unknown  time  . glimepiride (AMARYL) 2 MG tablet Take 2 mg by mouth daily.    06/06/2014 at Unknown time  . ketorolac (ACULAR) 0.4 % SOLN Apply 1 drop to eye 4 (four) times daily. PRIOR TO SURGERY   06/06/2014 at Unknown time  . levothyroxine (SYNTHROID, LEVOTHROID) 50 MCG tablet Take 50 mcg by mouth daily before breakfast.   06/06/2014 at Unknown time  . metFORMIN (GLUCOPHAGE) 500 MG tablet Take 500 mg by mouth 2 (two) times daily with a meal.   06/06/2014 at Unknown time  . metoprolol tartrate (LOPRESSOR) 25 MG tablet Take 25 mg by mouth 2 (two) times daily.   06/06/2014 at Unknown time  . Multiple Vitamins-Minerals (ICAPS) TABS Take 1 tablet by mouth daily.   06/06/2014 at Unknown time  . ofloxacin (OCUFLOX) 0.3 % ophthalmic solution Apply 1 drop to eye 4 (four) times daily. PRIOR TO SURGERY   06/06/2014 at Unknown time  . Omega-3 Fatty Acids (FISH OIL) 1200 MG CAPS Take 1 capsule by mouth daily.   06/06/2014 at Unknown time  . OMEGA-3 KRILL OIL PO Take 1 capsule by mouth daily.   06/06/2014 at Unknown time  . prednisoLONE acetate (PRED FORTE) 1 % ophthalmic suspension Apply 1 drop to eye 4 (four) times daily. AFTER SURGERY   06/06/2014 at Unknown time  . warfarin (COUMADIN) 2.5 MG tablet Take by mouth every evening.    06/05/2014 at Unknown time   Assessment:  Levaquin for CAP Reduced renal function, CrCl 31.9 ml/min   Goal of Therapy:  Eradicate infection  Plan:  Change Levaquin to 750 mg IV every 48 hours, due to renal function. Monitor renal function. Labs per protocol  Raquel JamesPittman, Abbas Beyene Bennett 06/06/2014,9:58 PM

## 2014-06-06 NOTE — Progress Notes (Signed)
ANTICOAGULATION CONSULT NOTE - Initial Consult  Pharmacy Consult for Coumadin Indication: atrial fibrillation  Allergies  Allergen Reactions  . Codeine Hives and Shortness Of Breath  . Aleve [Naproxen Sodium]     Swelling in feet  . Hydrocodone   . Oxycodone   . Shellfish Allergy Swelling  . Tetanus Toxoids     Allergic to shellfish   . Penicillins Rash  . Sulfa Antibiotics Rash    Patient Measurements: Height: 5\' 8"  (172.7 cm) Weight: 192 lb (87.091 kg) IBW/kg (Calculated) : 63.9 Heparin Dosing Weight:   Vital Signs: Temp: 98 F (36.7 C) (03/07 2117) Temp Source: Oral (03/07 2117) BP: 129/46 mmHg (03/07 2117) Pulse Rate: 66 (03/07 2117)  Labs:  Recent Labs  06/06/14 1213  HGB 8.9*  HCT 27.4*  PLT 115*  LABPROT 28.4*  INR 2.64*  CREATININE 1.57*  TROPONINI <0.03    Estimated Creatinine Clearance: 31.9 mL/min (by C-G formula based on Cr of 1.57).   Medical History: Past Medical History  Diagnosis Date  . Diabetes mellitus   . Atrial fibrillation   . Hypotension   . Glaucoma   . Cataract     Medications:  Prescriptions prior to admission  Medication Sig Dispense Refill Last Dose  . albuterol-ipratropium (COMBIVENT) 18-103 MCG/ACT inhaler Inhale 2 puffs into the lungs every 4 (four) hours as needed for wheezing. 1 Inhaler 0   . alendronate (FOSAMAX) 70 MG tablet Take 70 mg by mouth once a week. Take with a full glass of water on an empty stomach.   Past Week at Unknown time  . aspirin EC 81 MG tablet Take 81 mg by mouth daily.   06/06/2014 at Unknown time  . cholecalciferol (VITAMIN D) 1000 UNITS tablet Take 2,000 Units by mouth daily.    06/06/2014 at Unknown time  . glimepiride (AMARYL) 2 MG tablet Take 2 mg by mouth daily.    06/06/2014 at Unknown time  . ketorolac (ACULAR) 0.4 % SOLN Apply 1 drop to eye 4 (four) times daily. PRIOR TO SURGERY   06/06/2014 at Unknown time  . levothyroxine (SYNTHROID, LEVOTHROID) 50 MCG tablet Take 50 mcg by mouth daily  before breakfast.   06/06/2014 at Unknown time  . metFORMIN (GLUCOPHAGE) 500 MG tablet Take 500 mg by mouth 2 (two) times daily with a meal.   06/06/2014 at Unknown time  . metoprolol tartrate (LOPRESSOR) 25 MG tablet Take 25 mg by mouth 2 (two) times daily.   06/06/2014 at Unknown time  . Multiple Vitamins-Minerals (ICAPS) TABS Take 1 tablet by mouth daily.   06/06/2014 at Unknown time  . ofloxacin (OCUFLOX) 0.3 % ophthalmic solution Apply 1 drop to eye 4 (four) times daily. PRIOR TO SURGERY   06/06/2014 at Unknown time  . Omega-3 Fatty Acids (FISH OIL) 1200 MG CAPS Take 1 capsule by mouth daily.   06/06/2014 at Unknown time  . OMEGA-3 KRILL OIL PO Take 1 capsule by mouth daily.   06/06/2014 at Unknown time  . prednisoLONE acetate (PRED FORTE) 1 % ophthalmic suspension Apply 1 drop to eye 4 (four) times daily. AFTER SURGERY   06/06/2014 at Unknown time  . warfarin (COUMADIN) 2.5 MG tablet Take by mouth every evening.    06/05/2014 at Unknown time    Assessment: 81 y.o. female presenting with weakness. History of AFIB, PTA Coumadin to be continued INR therapeutic on admission. No bleeding noted  Goal of Therapy:  INR 2-3 Monitor platelets by anticoagulation protocol: Yes   Plan:  Coumadin 2.5 mg po x 1 dose tonight, home regiment INR/PT daily Monitor CBC, platelets  Tracey Heath 06/06/2014,9:37 PM

## 2014-06-06 NOTE — ED Provider Notes (Signed)
CSN: [id number]     Arrival date & time 06/06/14  1152 History  This chart was scribed for Vanetta Mulders, MD by Abel Presto, ED Scribe. This patient was seen in room APA03/APA03 and the patient's care was started at 3:35 PM.     Chief Complaint  Patient presents with  . Weakness     Patient is a 81 y.o. female presenting with weakness. The history is provided by the patient. No language interpreter was used.  Weakness Associated symptoms include shortness of breath. Pertinent negatives include no chest pain, no abdominal pain and no headaches.   HPI Comments: TINITA BROOKER is a 81 y.o. female with PMHx of glaucoma, cataracts AFib, DM, carotid bruit (reported by pt), who presents to the Emergency Department complaining of weakness in bilateral hips and LEs with onset a month ago. Pt was sent to ED from PCP, Dr. Janna Arch, for evaluation.  Pt notes dull and aching pain in area of bruise to left thigh with onset 2-3 weeks ago, intermittent swelling in legs for 1.5 months and SOB. Pt is on warfarin.  Pt denies fever, back pain, chest pain, and abdominal pain.   Past Medical History  Diagnosis Date  . Diabetes mellitus   . Atrial fibrillation   . Hypotension   . Glaucoma   . Cataract    History reviewed. No pertinent past surgical history. History reviewed. No pertinent family history. History  Substance Use Topics  . Smoking status: Never Smoker   . Smokeless tobacco: Not on file  . Alcohol Use: No   OB History    No data available     Review of Systems  Constitutional: Negative for fever and chills.  HENT: Negative for rhinorrhea and sore throat.   Eyes: Negative for visual disturbance.  Respiratory: Positive for shortness of breath. Negative for cough.   Cardiovascular: Positive for leg swelling. Negative for chest pain.  Gastrointestinal: Negative for nausea, vomiting, abdominal pain and diarrhea.  Genitourinary: Negative for dysuria and frequency.  Musculoskeletal:  Positive for myalgias. Negative for back pain.  Neurological: Positive for weakness. Negative for numbness and headaches.  Hematological: Does not bruise/bleed easily.  Psychiatric/Behavioral: Negative for confusion.      Allergies  Codeine; Aleve; Hydrocodone; Oxycodone; Shellfish allergy; Tetanus toxoids; Penicillins; and Sulfa antibiotics  Home Medications   Prior to Admission medications   Medication Sig Start Date End Date Taking? Authorizing Provider  albuterol-ipratropium (COMBIVENT) 18-103 MCG/ACT inhaler Inhale 2 puffs into the lungs every 4 (four) hours as needed for wheezing. 12/19/11  Yes Elizabeth A White, PA-C  alendronate (FOSAMAX) 70 MG tablet Take 70 mg by mouth once a week. Take with a full glass of water on an empty stomach.   Yes Historical Provider, MD  aspirin EC 81 MG tablet Take 81 mg by mouth daily.   Yes Historical Provider, MD  cholecalciferol (VITAMIN D) 1000 UNITS tablet Take 2,000 Units by mouth daily.    Yes Historical Provider, MD  glimepiride (AMARYL) 2 MG tablet Take 2 mg by mouth daily.    Yes Historical Provider, MD  ketorolac (ACULAR) 0.4 % SOLN Apply 1 drop to eye 4 (four) times daily. PRIOR TO SURGERY 05/26/14  Yes Historical Provider, MD  levothyroxine (SYNTHROID, LEVOTHROID) 50 MCG tablet Take 50 mcg by mouth daily before breakfast.   Yes Historical Provider, MD  metFORMIN (GLUCOPHAGE) 500 MG tablet Take 500 mg by mouth 2 (two) times daily with a meal.   Yes Historical Provider, MD  metoprolol tartrate (LOPRESSOR) 25 MG tablet Take 25 mg by mouth 2 (two) times daily.   Yes Historical Provider, MD  Multiple Vitamins-Minerals (ICAPS) TABS Take 1 tablet by mouth daily.   Yes Historical Provider, MD  ofloxacin (OCUFLOX) 0.3 % ophthalmic solution Apply 1 drop to eye 4 (four) times daily. PRIOR TO SURGERY 05/24/14  Yes Historical Provider, MD  Omega-3 Fatty Acids (FISH OIL) 1200 MG CAPS Take 1 capsule by mouth daily.   Yes Historical Provider, MD  OMEGA-3  KRILL OIL PO Take 1 capsule by mouth daily.   Yes Historical Provider, MD  prednisoLONE acetate (PRED FORTE) 1 % ophthalmic suspension Apply 1 drop to eye 4 (four) times daily. AFTER SURGERY 05/24/14  Yes Historical Provider, MD  warfarin (COUMADIN) 2.5 MG tablet Take by mouth every evening.    Yes Historical Provider, MD   BP 109/60 mmHg  Pulse 70  Temp(Src) 98.3 F (36.8 C) (Oral)  Resp 21  Ht 5\' 8"  (1.727 m)  Wt 192 lb (87.091 kg)  BMI 29.20 kg/m2  SpO2 96% Physical Exam  Constitutional: She is oriented to person, place, and time. She appears well-developed and well-nourished.  HENT:  Head: Normocephalic.  Eyes: Conjunctivae are normal. Pupils are equal, round, and reactive to light. No scleral icterus.  Clouded on left eye h/o glaucoma  Neck: Normal range of motion. Neck supple.  Cardiovascular: Normal rate and regular rhythm.  Exam reveals no friction rub.   Murmur (heard best on the left) heard. Pulmonary/Chest: Effort normal and breath sounds normal. No respiratory distress. She has no wheezes. She has no rales.  Abdominal: Soft. Bowel sounds are normal. There is no tenderness.  Musculoskeletal: Normal range of motion. She exhibits edema (bilateral pitting edema LEs).       Left elbow: She exhibits swelling.       Left wrist: She exhibits swelling.  Neurological: She is alert and oriented to person, place, and time. No cranial nerve deficit. She exhibits normal muscle tone. Coordination normal.  Pt able to lift leg better on left than right  Skin: Skin is warm and dry.  Round scab approximately 3 cm to left lateral hip  Psychiatric: She has a normal mood and affect. Her behavior is normal.  Nursing note and vitals reviewed.   ED Course  Procedures (including critical care time) DIAGNOSTIC STUDIES: Oxygen Saturation is 100% on room air, normal by my interpretation.    COORDINATION OF CARE: 3:47 PM Discussed treatment plan with patient at beside, the patient agrees with  the plan and has no further questions at this time.   Labs Review Labs Reviewed  CBC - Abnormal; Notable for the following:    RBC 2.80 (*)    Hemoglobin 8.9 (*)    HCT 27.4 (*)    RDW 17.2 (*)    Platelets 115 (*)    All other components within normal limits  BASIC METABOLIC PANEL - Abnormal; Notable for the following:    Glucose, Bld 157 (*)    BUN 35 (*)    Creatinine, Ser 1.57 (*)    GFR calc non Af Amer 30 (*)    GFR calc Af Amer 34 (*)    All other components within normal limits  URINALYSIS, ROUTINE W REFLEX MICROSCOPIC - Abnormal; Notable for the following:    Bilirubin Urine SMALL (*)    All other components within normal limits  HEPATIC FUNCTION PANEL - Abnormal; Notable for the following:    Albumin 2.6 (*)  Total Bilirubin 1.9 (*)    Bilirubin, Direct 0.6 (*)    Indirect Bilirubin 1.3 (*)    All other components within normal limits  PROTIME-INR - Abnormal; Notable for the following:    Prothrombin Time 28.4 (*)    INR 2.64 (*)    All other components within normal limits  BRAIN NATRIURETIC PEPTIDE - Abnormal; Notable for the following:    B Natriuretic Peptide 623.0 (*)    All other components within normal limits  COMPREHENSIVE METABOLIC PANEL - Abnormal; Notable for the following:    Glucose, Bld 123 (*)    BUN 35 (*)    Creatinine, Ser 1.46 (*)    Calcium 8.2 (*)    Total Protein 5.4 (*)    Albumin 2.1 (*)    Total Bilirubin 1.5 (*)    GFR calc non Af Amer 32 (*)    GFR calc Af Amer 37 (*)    All other components within normal limits  CBC WITH DIFFERENTIAL/PLATELET - Abnormal; Notable for the following:    WBC 3.8 (*)    RBC 2.34 (*)    Hemoglobin 7.6 (*)    HCT 22.6 (*)    RDW 17.1 (*)    Platelets 85 (*)    All other components within normal limits  LACTIC ACID, PLASMA - Abnormal; Notable for the following:    Lactic Acid, Venous 2.5 (*)    All other components within normal limits  LACTIC ACID, PLASMA - Abnormal; Notable for the following:     Lactic Acid, Venous 2.5 (*)    All other components within normal limits  PROTIME-INR - Abnormal; Notable for the following:    Prothrombin Time 32.5 (*)    INR 3.14 (*)    All other components within normal limits  VITAMIN B12 - Abnormal; Notable for the following:    Vitamin B-12 1030 (*)    All other components within normal limits  IRON AND TIBC - Abnormal; Notable for the following:    TIBC 133 (*)    UIBC 71 (*)    All other components within normal limits  RETICULOCYTES - Abnormal; Notable for the following:    RBC. 2.34 (*)    All other components within normal limits  SEDIMENTATION RATE - Abnormal; Notable for the following:    Sed Rate 35 (*)    All other components within normal limits  PREALBUMIN - Abnormal; Notable for the following:    Prealbumin 3.8 (*)    All other components within normal limits  TSH - Abnormal; Notable for the following:    TSH 4.777 (*)    All other components within normal limits  PROTIME-INR - Abnormal; Notable for the following:    Prothrombin Time 30.9 (*)    INR 2.94 (*)    All other components within normal limits  CBC - Abnormal; Notable for the following:    RBC 2.26 (*)    Hemoglobin 7.4 (*)    HCT 22.0 (*)    RDW 17.1 (*)    Platelets 81 (*)    All other components within normal limits  CULTURE, BLOOD (ROUTINE X 2)  CULTURE, BLOOD (ROUTINE X 2)  CULTURE, EXPECTORATED SPUTUM-ASSESSMENT  GRAM STAIN  LIPASE, BLOOD  TROPONIN I  INFLUENZA PANEL BY PCR (TYPE A & B, H1N1)  TSH  FOLATE  FERRITIN  RPR  TSH  LEGIONELLA ANTIGEN, URINE  STREP PNEUMONIAE URINARY ANTIGEN  RETICULOCYTES    Imaging Review Dg Chest 1 View  06/06/2014   CLINICAL DATA:  Status post fall 3 times over the weekend  EXAM: CHEST  1 VIEW  COMPARISON:  December 16, 2011  FINDINGS: The mediastinal contour is normal. The heart size is probably enlarged. There is patchy consolidation of medial left lung base. There is no pulmonary edema or pleural effusion.  There is chronic deformity of the lateral right fourth rib. There is deformity of the lateral right fifth rib, acute fracture is not excluded if the patient has focal pain here. There is no pneumothorax.  IMPRESSION: Patchy consolidation of medial left lung base suspicious for pneumonia.  Deformity of the lateral right fifth rib, acute fracture is not excluded if the patient has focal pain here.   Electronically Signed   By: Sherian Rein M.D.   On: 06/06/2014 17:20   Ct Head Wo Contrast  06/06/2014   CLINICAL DATA:  81 year old female with weakness  EXAM: CT HEAD WITHOUT CONTRAST  TECHNIQUE: Contiguous axial images were obtained from the base of the skull through the vertex without intravenous contrast.  COMPARISON:  Prior CT scan of the head and cervical spine 12/13/2011  FINDINGS: Negative for acute intracranial hemorrhage, acute infarction, mass, mass effect, hydrocephalus or midline shift. Gray-white differentiation is preserved throughout. Relatively mild cortical atrophy for age. Mild periventricular and and subcortical white matter hypoattenuation consistent with chronic microvascular ischemic white matter disease is also similar in unchanged compared to prior. No focal soft tissue or calvarial abnormality. Bilateral globes and orbits are symmetric bilaterally. Normal aeration of the mastoid air cells and visualized paranasal sinuses. Atherosclerotic calcification in both cavernous carotid arteries. Hyperostosis frontalis interna noted incidentally. Stable small lucency in the right parieto-occipital calvarium dating back to 2013 and therefore likely benign.  IMPRESSION: 1. No acute intracranial abnormality. 2. Stable mild atrophy and chronic microvascular ischemic white matter disease.   Electronically Signed   By: Malachy Moan M.D.   On: 06/06/2014 16:54   Dg Hips Bilat With Pelvis 3-4 Views  06/06/2014   CLINICAL DATA:  Multiple falls.  Bilateral hip tenderness.  EXAM: BILATERAL HIP (WITH PELVIS)  3-4 VIEWS  COMPARISON:  None.  FINDINGS: Mild symmetric degenerative changes in the hips bilaterally. No acute bony abnormality. Specifically, no fracture, subluxation, or dislocation. Soft tissues are intact.  IMPRESSION: No acute bony abnormality.   Electronically Signed   By: Charlett Nose M.D.   On: 06/06/2014 16:59     EKG Interpretation   Date/Time:  Monday June 06 2014 12:04:39 EST Ventricular Rate:  60 PR Interval:  146 QRS Duration: 82 QT Interval:  462 QTC Calculation: 462 R Axis:   8 Text Interpretation:  Normal sinus rhythm Cannot rule out Anterior infarct  , age undetermined Abnormal ECG Incomplete right bundle branch block  Confirmed by Raeleigh Guinn  MD, Kailei Cowens [phone]) on 06/06/2014 3:14:35 PM      MDM   Final diagnoses:  Left hip pain  Weakness  CAP (community acquired pneumonia)    Patient with a community-acquired pneumonia patient with severe weakness lives by herself. Patient will require admission. Patient started on community-acquired antibiotics here Lovenox can she has penicillin allergy. Patient without significant hypoxia. Patient will be admitted by internal medicine.   I personally performed the services described in this documentation, which was scribed in my presence. The recorded information has been reviewed and is accurate.      Vanetta Mulders, MD 06/08/14 [phone]

## 2014-06-07 ENCOUNTER — Observation Stay (HOSPITAL_COMMUNITY): Payer: Medicare HMO

## 2014-06-07 DIAGNOSIS — D638 Anemia in other chronic diseases classified elsewhere: Secondary | ICD-10-CM | POA: Diagnosis not present

## 2014-06-07 DIAGNOSIS — H409 Unspecified glaucoma: Secondary | ICD-10-CM | POA: Diagnosis present

## 2014-06-07 DIAGNOSIS — Z7982 Long term (current) use of aspirin: Secondary | ICD-10-CM | POA: Diagnosis not present

## 2014-06-07 DIAGNOSIS — J189 Pneumonia, unspecified organism: Secondary | ICD-10-CM | POA: Diagnosis present

## 2014-06-07 DIAGNOSIS — D696 Thrombocytopenia, unspecified: Secondary | ICD-10-CM | POA: Diagnosis not present

## 2014-06-07 DIAGNOSIS — Z880 Allergy status to penicillin: Secondary | ICD-10-CM | POA: Diagnosis not present

## 2014-06-07 DIAGNOSIS — G249 Dystonia, unspecified: Secondary | ICD-10-CM | POA: Diagnosis present

## 2014-06-07 DIAGNOSIS — R531 Weakness: Secondary | ICD-10-CM | POA: Diagnosis present

## 2014-06-07 DIAGNOSIS — I482 Chronic atrial fibrillation: Secondary | ICD-10-CM | POA: Diagnosis present

## 2014-06-07 DIAGNOSIS — I509 Heart failure, unspecified: Secondary | ICD-10-CM | POA: Diagnosis present

## 2014-06-07 DIAGNOSIS — I350 Nonrheumatic aortic (valve) stenosis: Secondary | ICD-10-CM | POA: Diagnosis present

## 2014-06-07 DIAGNOSIS — Z885 Allergy status to narcotic agent status: Secondary | ICD-10-CM | POA: Diagnosis not present

## 2014-06-07 DIAGNOSIS — Z7983 Long term (current) use of bisphosphonates: Secondary | ICD-10-CM | POA: Diagnosis not present

## 2014-06-07 DIAGNOSIS — Z7901 Long term (current) use of anticoagulants: Secondary | ICD-10-CM | POA: Diagnosis not present

## 2014-06-07 DIAGNOSIS — R627 Adult failure to thrive: Secondary | ICD-10-CM | POA: Diagnosis present

## 2014-06-07 DIAGNOSIS — D61818 Other pancytopenia: Secondary | ICD-10-CM | POA: Diagnosis present

## 2014-06-07 DIAGNOSIS — E119 Type 2 diabetes mellitus without complications: Secondary | ICD-10-CM | POA: Diagnosis present

## 2014-06-07 DIAGNOSIS — N289 Disorder of kidney and ureter, unspecified: Secondary | ICD-10-CM | POA: Diagnosis present

## 2014-06-07 DIAGNOSIS — Z882 Allergy status to sulfonamides status: Secondary | ICD-10-CM | POA: Diagnosis not present

## 2014-06-07 DIAGNOSIS — D72819 Decreased white blood cell count, unspecified: Secondary | ICD-10-CM | POA: Diagnosis not present

## 2014-06-07 DIAGNOSIS — E46 Unspecified protein-calorie malnutrition: Secondary | ICD-10-CM | POA: Diagnosis present

## 2014-06-07 DIAGNOSIS — I959 Hypotension, unspecified: Secondary | ICD-10-CM | POA: Diagnosis present

## 2014-06-07 LAB — CBC WITH DIFFERENTIAL/PLATELET
BASOS PCT: 0 % (ref 0–1)
Basophils Absolute: 0 10*3/uL (ref 0.0–0.1)
Eosinophils Absolute: 0.1 10*3/uL (ref 0.0–0.7)
Eosinophils Relative: 2 % (ref 0–5)
HCT: 22.6 % — ABNORMAL LOW (ref 36.0–46.0)
Hemoglobin: 7.6 g/dL — ABNORMAL LOW (ref 12.0–15.0)
LYMPHS PCT: 22 % (ref 12–46)
Lymphs Abs: 0.8 10*3/uL (ref 0.7–4.0)
MCH: 32.5 pg (ref 26.0–34.0)
MCHC: 33.6 g/dL (ref 30.0–36.0)
MCV: 96.6 fL (ref 78.0–100.0)
Monocytes Absolute: 0.4 10*3/uL (ref 0.1–1.0)
Monocytes Relative: 10 % (ref 3–12)
NEUTROS ABS: 2.5 10*3/uL (ref 1.7–7.7)
NEUTROS PCT: 66 % (ref 43–77)
PLATELETS: 85 10*3/uL — AB (ref 150–400)
RBC: 2.34 MIL/uL — ABNORMAL LOW (ref 3.87–5.11)
RDW: 17.1 % — AB (ref 11.5–15.5)
Smear Review: DECREASED
WBC: 3.8 10*3/uL — ABNORMAL LOW (ref 4.0–10.5)

## 2014-06-07 LAB — COMPREHENSIVE METABOLIC PANEL
ALT: 15 U/L (ref 0–35)
AST: 22 U/L (ref 0–37)
Albumin: 2.1 g/dL — ABNORMAL LOW (ref 3.5–5.2)
Alkaline Phosphatase: 40 U/L (ref 39–117)
Anion gap: 6 (ref 5–15)
BUN: 35 mg/dL — AB (ref 6–23)
CALCIUM: 8.2 mg/dL — AB (ref 8.4–10.5)
CO2: 21 mmol/L (ref 19–32)
Chloride: 112 mmol/L (ref 96–112)
Creatinine, Ser: 1.46 mg/dL — ABNORMAL HIGH (ref 0.50–1.10)
GFR, EST AFRICAN AMERICAN: 37 mL/min — AB (ref >90)
GFR, EST NON AFRICAN AMERICAN: 32 mL/min — AB (ref >90)
GLUCOSE: 123 mg/dL — AB (ref 70–99)
Potassium: 4.5 mmol/L (ref 3.5–5.1)
Sodium: 139 mmol/L (ref 135–145)
TOTAL PROTEIN: 5.4 g/dL — AB (ref 6.0–8.3)
Total Bilirubin: 1.5 mg/dL — ABNORMAL HIGH (ref 0.3–1.2)

## 2014-06-07 LAB — VITAMIN B12: Vitamin B-12: 1030 pg/mL — ABNORMAL HIGH (ref 211–911)

## 2014-06-07 LAB — RETICULOCYTES
RBC.: 2.34 MIL/uL — AB (ref 3.87–5.11)
RETIC COUNT ABSOLUTE: 42.1 10*3/uL (ref 19.0–186.0)
Retic Ct Pct: 1.8 % (ref 0.4–3.1)

## 2014-06-07 LAB — PROTIME-INR
INR: 3.14 — ABNORMAL HIGH (ref 0.00–1.49)
Prothrombin Time: 32.5 seconds — ABNORMAL HIGH (ref 11.6–15.2)

## 2014-06-07 LAB — IRON AND TIBC
IRON: 62 ug/dL (ref 42–145)
SATURATION RATIOS: 47 % (ref 20–55)
TIBC: 133 ug/dL — ABNORMAL LOW (ref 250–470)
UIBC: 71 ug/dL — ABNORMAL LOW (ref 125–400)

## 2014-06-07 LAB — PREALBUMIN: Prealbumin: 3.8 mg/dL — ABNORMAL LOW (ref 17.0–34.0)

## 2014-06-07 LAB — SEDIMENTATION RATE: Sed Rate: 35 mm/hr — ABNORMAL HIGH (ref 0–22)

## 2014-06-07 LAB — INFLUENZA PANEL BY PCR (TYPE A & B)
H1N1 flu by pcr: NOT DETECTED
Influenza A By PCR: NEGATIVE
Influenza B By PCR: NEGATIVE

## 2014-06-07 LAB — FOLATE: FOLATE: 10.8 ng/mL

## 2014-06-07 LAB — FERRITIN: FERRITIN: 144 ng/mL (ref 10–291)

## 2014-06-07 LAB — TSH: TSH: 3.943 u[IU]/mL (ref 0.350–4.500)

## 2014-06-07 LAB — LACTIC ACID, PLASMA: Lactic Acid, Venous: 2.5 mmol/L (ref 0.5–2.0)

## 2014-06-07 MED ORDER — WARFARIN - PHARMACIST DOSING INPATIENT
Status: DC
  Administered 2014-06-07 – 2014-06-08 (×2)

## 2014-06-07 MED ORDER — WARFARIN SODIUM 1 MG PO TABS
1.0000 mg | ORAL_TABLET | Freq: Once | ORAL | Status: AC
  Administered 2014-06-07: 1 mg via ORAL
  Filled 2014-06-07: qty 1

## 2014-06-07 NOTE — Progress Notes (Signed)
Specifically never wanted anything done concerning it a history of paroxysmal atrial fibrillation currently in sinus rhythm anticoagulated with patchy left lower lobe possible infiltrate currently on Levaquin patient is a no code no intubation likewise found to be anemic with generalized dystonia Tracey Heath [id number] DOB: April 05, 1929 DOA: 06/06/2014 PCP: Isabella StallingNDIEGO,Tracey Bashor Heath, Tracey Heath             Physical Exam: Blood pressure 129/46, pulse 66, temperature 98 F (36.7 C), temperature source Oral, resp. rate 16, height 5\' 8"  (1.727 Heath), weight 192 lb (87.091 kg), SpO2 100 %. lungs show diminished breath sounds at bases left greater than right question of rales in the left base no wheezes audible heart regular rhythm 2 to 3/6 aortic systolic murmur consistent with aortic stenosis no S3 audible present no rubs or thrills extremities 1+ chronic pitting edema   Investigations:  Recent Results (from the past 240 hour(s))  Culture, blood (routine x 2) Call Tracey Heath if unable to obtain prior to antibiotics being given     Status: None (Preliminary result)   Collection Time: 06/06/14  9:27 PM  Result Value Ref Range Status   Specimen Description LEFT ANTECUBITAL  Final   Special Requests BOTTLES DRAWN AEROBIC AND ANAEROBIC [facility]6CC EACH  Final   Culture PENDING  Incomplete   Report Status PENDING  Incomplete  Culture, blood (routine x 2) Call Tracey Heath if unable to obtain prior to antibiotics being given     Status: None (Preliminary result)   Collection Time: 06/06/14  9:27 PM  Result Value Ref Range Status   Specimen Description LEFT ANTECUBITAL  Final   Special Requests BOTTLES DRAWN AEROBIC AND ANAEROBIC 4CC EACH  Final   Culture PENDING  Incomplete   Report Status PENDING  Incomplete     Basic Metabolic Panel:  Recent Labs  81/19/1401/10/14 1213  NA 138  K 4.5  CL 109  CO2 20  GLUCOSE 157*  BUN 35*  CREATININE 1.57*  CALCIUM 8.8   Liver Function Tests:  Recent Labs  06/06/14 1213  AST 29   ALT 18  ALKPHOS 49  BILITOT 1.9*  PROT 6.4  ALBUMIN 2.6*     CBC:  Recent Labs  06/06/14 1213  WBC 5.8  HGB 8.9*  HCT 27.4*  MCV 97.9  PLT 115*    Dg Chest 1 View  06/06/2014   CLINICAL DATA:  Status post fall 3 times over the weekend  EXAM: CHEST  1 VIEW  COMPARISON:  December 16, 2011  FINDINGS: The mediastinal contour is normal. The heart size is probably enlarged. There is patchy consolidation of medial left lung base. There is no pulmonary edema or pleural effusion. There is chronic deformity of the lateral right fourth rib. There is deformity of the lateral right fifth rib, acute fracture is not excluded if the patient has focal pain here. There is no pneumothorax.  IMPRESSION: Patchy consolidation of medial left lung base suspicious for pneumonia.  Deformity of the lateral right fifth rib, acute fracture is not excluded if the patient has focal pain here.   Electronically Signed   By: Sherian ReinWei-Chen  Lin Heath.D.   On: 06/06/2014 17:20   Ct Head Wo Contrast  06/06/2014   CLINICAL DATA:  81 year old female with weakness  EXAM: CT HEAD WITHOUT CONTRAST  TECHNIQUE: Contiguous axial images were obtained from the base of the skull through the vertex without intravenous contrast.  COMPARISON:  Prior CT scan of the head and cervical spine 12/13/2011  FINDINGS: Negative  for acute intracranial hemorrhage, acute infarction, mass, mass effect, hydrocephalus or midline shift. Gray-white differentiation is preserved throughout. Relatively mild cortical atrophy for age. Mild periventricular and and subcortical white matter hypoattenuation consistent with chronic microvascular ischemic white matter disease is also similar in unchanged compared to prior. No focal soft tissue or calvarial abnormality. Bilateral globes and orbits are symmetric bilaterally. Normal aeration of the mastoid air cells and visualized paranasal sinuses. Atherosclerotic calcification in both cavernous carotid arteries. Hyperostosis  frontalis interna noted incidentally. Stable small lucency in the right parieto-occipital calvarium dating back to 2013 and therefore likely benign.  IMPRESSION: 1. No acute intracranial abnormality. 2. Stable mild atrophy and chronic microvascular ischemic white matter disease.   Electronically Signed   By: Malachy Moan Heath.D.   On: 06/06/2014 16:54   Dg Hips Bilat With Pelvis 3-4 Views  06/06/2014   CLINICAL DATA:  Multiple falls.  Bilateral hip tenderness.  EXAM: BILATERAL HIP (WITH PELVIS) 3-4 VIEWS  COMPARISON:  None.  FINDINGS: Mild symmetric degenerative changes in the hips bilaterally. No acute bony abnormality. Specifically, no fracture, subluxation, or dislocation. Soft tissues are intact.  IMPRESSION: No acute bony abnormality.   Electronically Signed   By: Charlett Nose Heath.D.   On: 06/06/2014 16:59      Medications:   Impression: Aortic stenosis  Principal Problem:   CAP (community acquired pneumonia) Active Problems:   Atrial fibrillation   Chronic anticoagulation   Anemia   Frequent falls   Generalized weakness   Renal insufficiency, mild   Thrombocytopenia   Undiagnosed cardiac murmurs   Elevated bilirubin     Plan: Pulmonary consultation requested. 2-D echo ordered to assess aortic stenosis anemia workup in progress dementia workup in progress continue IV Levaquin   Consultants: Physical therapy and pulmonology requested.   Procedures 2-D echo ordered   Antibiotics: IV Levaquin                  Code Status: No code no intubation  Family Communication:    Disposition Plan continue IV antibiotics anemia and dementia workup [place] consultation physical therapy consultation requested 2-D echo ordered to assess aortic stenosis severity  Time spent: 30 minutes     Tracey Heath   06/07/2014, 6:37 AM

## 2014-06-07 NOTE — Progress Notes (Signed)
ANTICOAGULATION CONSULT NOTE - follow up  Pharmacy Consult for Coumadin Indication: atrial fibrillation  Allergies  Allergen Reactions  . Codeine Hives and Shortness Of Breath  . Aleve [Naproxen Sodium]     Swelling in feet  . Hydrocodone   . Oxycodone   . Shellfish Allergy Swelling  . Tetanus Toxoids     Allergic to shellfish   . Penicillins Rash  . Sulfa Antibiotics Rash   Patient Measurements: Height: 5\' 8"  (172.7 cm) Weight: 192 lb (87.091 kg) IBW/kg (Calculated) : 63.9   Vital Signs: BP: 129/46 mmHg (03/08 1119) Pulse Rate: 67 (03/08 1119)  Labs:  Recent Labs  06/06/14 1213 06/07/14 0648  HGB 8.9* 7.6*  HCT 27.4* 22.6*  PLT 115* 85*  LABPROT 28.4* 32.5*  INR 2.64* 3.14*  CREATININE 1.57* 1.46*  TROPONINI <0.03  --    Estimated Creatinine Clearance: 34.3 mL/min (by C-G formula based on Cr of 1.46).  Medical History: Past Medical History  Diagnosis Date  . Diabetes mellitus   . Atrial fibrillation   . Hypotension   . Glaucoma   . Cataract    Medications:  Prescriptions prior to admission  Medication Sig Dispense Refill Last Dose  . albuterol-ipratropium (COMBIVENT) 18-103 MCG/ACT inhaler Inhale 2 puffs into the lungs every 4 (four) hours as needed for wheezing. 1 Inhaler 0   . alendronate (FOSAMAX) 70 MG tablet Take 70 mg by mouth once a week. Take with a full glass of water on an empty stomach.   Past Week at Unknown time  . aspirin EC 81 MG tablet Take 81 mg by mouth daily.   06/06/2014 at Unknown time  . cholecalciferol (VITAMIN D) 1000 UNITS tablet Take 2,000 Units by mouth daily.    06/06/2014 at Unknown time  . glimepiride (AMARYL) 2 MG tablet Take 2 mg by mouth daily.    06/06/2014 at Unknown time  . ketorolac (ACULAR) 0.4 % SOLN Apply 1 drop to eye 4 (four) times daily. PRIOR TO SURGERY   06/06/2014 at Unknown time  . levothyroxine (SYNTHROID, LEVOTHROID) 50 MCG tablet Take 50 mcg by mouth daily before breakfast.   06/06/2014 at Unknown time  .  metFORMIN (GLUCOPHAGE) 500 MG tablet Take 500 mg by mouth 2 (two) times daily with a meal.   06/06/2014 at Unknown time  . metoprolol tartrate (LOPRESSOR) 25 MG tablet Take 25 mg by mouth 2 (two) times daily.   06/06/2014 at Unknown time  . Multiple Vitamins-Minerals (ICAPS) TABS Take 1 tablet by mouth daily.   06/06/2014 at Unknown time  . ofloxacin (OCUFLOX) 0.3 % ophthalmic solution Apply 1 drop to eye 4 (four) times daily. PRIOR TO SURGERY   06/06/2014 at Unknown time  . Omega-3 Fatty Acids (FISH OIL) 1200 MG CAPS Take 1 capsule by mouth daily.   06/06/2014 at Unknown time  . OMEGA-3 KRILL OIL PO Take 1 capsule by mouth daily.   06/06/2014 at Unknown time  . prednisoLONE acetate (PRED FORTE) 1 % ophthalmic suspension Apply 1 drop to eye 4 (four) times daily. AFTER SURGERY   06/06/2014 at Unknown time  . warfarin (COUMADIN) 2.5 MG tablet Take by mouth every evening.    06/05/2014 at Unknown time   Assessment: 81 y.o. female presenting with weakness. History of AFIB, PTA Coumadin to be continued INR therapeutic on admission but has trended up to > 3 today.  H/H - PLTS trending down. No bleeding reported but anemia noted.  Goal of Therapy:  INR 2-3 Monitor platelets  by anticoagulation protocol: Yes   Plan:  Coumadin  po today x 1 (dose reduction) INR/PT daily Monitor CBC, platelets daily for now  Valrie Hart A 06/07/2014,11:24 AM

## 2014-06-07 NOTE — Progress Notes (Addendum)
US called saying that pt had eaten dinner today and that the pt needed to be NPO before US. US will be completed in the morning and will be NPO at midnight. Attempted to notify MD. Left message on voicemail to return call

## 2014-06-07 NOTE — Care Management Note (Addendum)
    Page 1 of 2   06/10/2014     1:28:32 PM CARE MANAGEMENT NOTE 06/10/2014  Patient:  Tracey Heath,Tracey Heath   Account Number:  [id number]  Date Initiated:  06/07/2014  Documentation initiated by:  Kathyrn SheriffHILDRESS,JESSICA  Subjective/Objective Assessment:   Pt is from home, lives alone, admitted with CAP. Pt has cane, walker, wheelchair, bsc at home already. Pt  [facility]. Pt would like [facility] if needed.     Action/Plan:   PT has recommended HH PT and 24/7 supervision. Pt's son says family is supportive and lives close by. PT to cont to work with pt. Will cont to follow for CM needs.   Anticipated DC Date:  06/10/2014   Anticipated DC Plan:  HOME W HOME HEALTH SERVICES  In-house referral  Clinical Social Worker      DC Planning Services  CM consult      [facility]   Choice offered to / List presented to:  C-1 Patient           HH agency  [facility]   Status of service:  Completed, signed off Medicare Important Message given?  YES (If response is "NO", the following Medicare IM given date fields will be blank) Date Medicare IM given:  06/10/2014 Medicare IM given by:  Kathyrn SheriffHILDRESS,JESSICA Date Additional Medicare IM given:   Additional Medicare IM given by:    Discharge Disposition:  SKILLED NURSING FACILITY  Per UR Regulation:  Reviewed for med. necessity/level of care/duration of stay  If discussed at Long Length of Stay Meetings, dates discussed:   06/09/2014    Comments:  06/10/2014 1330 Kathyrn SheriffJessica Childress, RN, MSN, CM Pt discharging today to SNF. CSW has arranged for placement. Pt and  family is aware. No further CM needs at this time. 06/09/2014 1130 Kathyrn SheriffJessica Childress, RN, MSN, CM PT has recommended SNF for pt at discharge. CSW is aware, has spoken with family and will arrange for discharge. Will cont to follow. 06/07/2014 1300 Kathyrn SheriffJessica Childress, RN, MSN, CM

## 2014-06-07 NOTE — Progress Notes (Signed)
UR completed 

## 2014-06-07 NOTE — Progress Notes (Signed)
  Echocardiogram 2D Echocardiogram has been performed.  [name], Tracey Heath 06/07/2014, 5:02 PM

## 2014-06-07 NOTE — Consult Note (Signed)
Consult requested by: Dr. Delbert Harness Consult requested for pneumonia:  HPI: This is an 81 year old who says that she had been in her usual state of fairly good health but in about the last 6 weeks or so she has had increasing problems with weakness and shortness of breath. She has been coughing some for the last week or so. She's noticed some swelling in her legs and has had a bruise in her left thigh. She says until several weeks ago she was able to do pretty much anything she wanted. She does have a history of atrial fibrillation and of diabetes.  Past Medical History  Diagnosis Date  . Diabetes mellitus   . Atrial fibrillation   . Hypotension   . Glaucoma   . Cataract      History reviewed. No pertinent family history.   History   Social History  . Marital Status: Widowed    Spouse Name: N/A  . Number of Children: N/A  . Years of Education: N/A   Social History Main Topics  . Smoking status: Never Smoker   . Smokeless tobacco: Not on file  . Alcohol Use: No  . Drug Use: No  . Sexual Activity: Not on file   Other Topics Concern  . None   Social History Narrative     ROS: She says she's been weak. She says she's been bruising easily. She has a cough. She does not have sputum production. Otherwise per the history and physical    Objective: Vital signs in last 24 hours: Temp:  [97.5 F (36.4 C)-98 F (36.7 C)] 98 F (36.7 C) (03/07 2117) Pulse Rate:  [56-66] 66 (03/07 2117) Resp:  [16-19] 16 (03/07 2117) BP: (109-150)/(46-84) 129/46 mmHg (03/07 2117) SpO2:  [99 %-100 %] 100 % (03/07 2117) Weight:  [87.091 kg (192 lb)] 87.091 kg (192 lb) (03/07 1201) Weight change:  Last BM Date: 06/06/14  Intake/Output from previous day: 03/07 0701 - 03/08 0700 In: -  Out: 180 [Urine:180]  PHYSICAL EXAM She looks chronically sick. She is pale. She looks short of breath at rest. Her HEENT shows that she has cloudiness of the left eye. Her nose and throat are clear. Her neck  is supple without masses. Her chest shows some rhonchi and diminished breath sounds. Her heart is regular with a fairly loud 4/6 systolic murmur. Her abdomen is soft. She does have some bruising. Central nervous system examination is grossly intact  Lab Results: Basic Metabolic Panel:  Recent Labs  16/10/96 1213 06/07/14 0648  NA 138 139  K 4.5 4.5  CL 109 112  CO2 20 21  GLUCOSE 157* 123*  BUN 35* 35*  CREATININE 1.57* 1.46*  CALCIUM 8.8 8.2*   Liver Function Tests:  Recent Labs  06/06/14 1213 06/07/14 0648  AST 29 22  ALT 18 15  ALKPHOS 49 40  BILITOT 1.9* 1.5*  PROT 6.4 5.4*  ALBUMIN 2.6* 2.1*    Recent Labs  06/06/14 1213  LIPASE 25   No results for input(s): AMMONIA in the last 72 hours. CBC:  Recent Labs  06/06/14 1213 06/07/14 0648  WBC 5.8 3.8*  NEUTROABS  --  2.5  HGB 8.9* 7.6*  HCT 27.4* 22.6*  MCV 97.9 96.6  PLT 115* 85*   Cardiac Enzymes:  Recent Labs  06/06/14 1213  TROPONINI <0.03   BNP: No results for input(s): PROBNP in the last 72 hours. D-Dimer: No results for input(s): DDIMER in the last 72 hours. CBG: No  results for input(s): GLUCAP in the last 72 hours. Hemoglobin A1C: No results for input(s): HGBA1C in the last 72 hours. Fasting Lipid Panel: No results for input(s): CHOL, HDL, LDLCALC, TRIG, CHOLHDL, LDLDIRECT in the last 72 hours. Thyroid Function Tests:  Recent Labs  06/07/14 0648  TSH 3.943   Anemia Panel:  Recent Labs  06/07/14 0648  RETICCTPCT 1.8   Coagulation:  Recent Labs  06/06/14 1213 06/07/14 0648  LABPROT 28.4* 32.5*  INR 2.64* 3.14*   Urine Drug Screen: Drugs of Abuse  No results found for: LABOPIA, COCAINSCRNUR, LABBENZ, AMPHETMU, THCU, LABBARB  Alcohol Level: No results for input(s): ETH in the last 72 hours. Urinalysis:  Recent Labs  06/06/14 1721  COLORURINE YELLOW  LABSPEC 1.020  PHURINE 5.5  GLUCOSEU NEGATIVE  HGBUR NEGATIVE  BILIRUBINUR SMALL*  KETONESUR NEGATIVE   PROTEINUR NEGATIVE  UROBILINOGEN 0.2  NITRITE NEGATIVE  LEUKOCYTESUR NEGATIVE   Misc. Labs:   ABGS: No results for input(s): PHART, PO2ART, TCO2, HCO3 in the last 72 hours.  Invalid input(s): PCO2   MICROBIOLOGY: Recent Results (from the past 240 hour(s))  Culture, blood (routine x 2) Call MD if unable to obtain prior to antibiotics being given     Status: None (Preliminary result)   Collection Time: 06/06/14  9:27 PM  Result Value Ref Range Status   Specimen Description LEFT ANTECUBITAL  Final   Special Requests BOTTLES DRAWN AEROBIC AND ANAEROBIC [facility] EACH  Final   Culture PENDING  Incomplete   Report Status PENDING  Incomplete  Culture, blood (routine x 2) Call MD if unable to obtain prior to antibiotics being given     Status: None (Preliminary result)   Collection Time: 06/06/14  9:27 PM  Result Value Ref Range Status   Specimen Description LEFT ANTECUBITAL  Final   Special Requests BOTTLES DRAWN AEROBIC AND ANAEROBIC 4CC EACH  Final   Culture PENDING  Incomplete   Report Status PENDING  Incomplete    Studies/Results: Dg Chest 1 View  06/06/2014   CLINICAL DATA:  Status post fall 3 times over the weekend  EXAM: CHEST  1 VIEW  COMPARISON:  December 16, 2011  FINDINGS: The mediastinal contour is normal. The heart size is probably enlarged. There is patchy consolidation of medial left lung base. There is no pulmonary edema or pleural effusion. There is chronic deformity of the lateral right fourth rib. There is deformity of the lateral right fifth rib, acute fracture is not excluded if the patient has focal pain here. There is no pneumothorax.  IMPRESSION: Patchy consolidation of medial left lung base suspicious for pneumonia.  Deformity of the lateral right fifth rib, acute fracture is not excluded if the patient has focal pain here.   Electronically Signed   By: Sherian Rein M.D.   On: 06/06/2014 17:20   Ct Head Wo Contrast  06/06/2014   CLINICAL DATA:  81 year old female  with weakness  EXAM: CT HEAD WITHOUT CONTRAST  TECHNIQUE: Contiguous axial images were obtained from the base of the skull through the vertex without intravenous contrast.  COMPARISON:  Prior CT scan of the head and cervical spine 12/13/2011  FINDINGS: Negative for acute intracranial hemorrhage, acute infarction, mass, mass effect, hydrocephalus or midline shift. Gray-white differentiation is preserved throughout. Relatively mild cortical atrophy for age. Mild periventricular and and subcortical white matter hypoattenuation consistent with chronic microvascular ischemic white matter disease is also similar in unchanged compared to prior. No focal soft tissue or calvarial abnormality. Bilateral globes  and orbits are symmetric bilaterally. Normal aeration of the mastoid air cells and visualized paranasal sinuses. Atherosclerotic calcification in both cavernous carotid arteries. Hyperostosis frontalis interna noted incidentally. Stable small lucency in the right parieto-occipital calvarium dating back to 2013 and therefore likely benign.  IMPRESSION: 1. No acute intracranial abnormality. 2. Stable mild atrophy and chronic microvascular ischemic white matter disease.   Electronically Signed   By: Malachy MoanHeath  McCullough M.D.   On: 06/06/2014 16:54   Dg Hips Bilat With Pelvis 3-4 Views  06/06/2014   CLINICAL DATA:  Multiple falls.  Bilateral hip tenderness.  EXAM: BILATERAL HIP (WITH PELVIS) 3-4 VIEWS  COMPARISON:  None.  FINDINGS: Mild symmetric degenerative changes in the hips bilaterally. No acute bony abnormality. Specifically, no fracture, subluxation, or dislocation. Soft tissues are intact.  IMPRESSION: No acute bony abnormality.   Electronically Signed   By: Charlett NoseKevin  Dover M.D.   On: 06/06/2014 16:59    Medications:  Prior to Admission:  Prescriptions prior to admission  Medication Sig Dispense Refill Last Dose  . albuterol-ipratropium (COMBIVENT) 18-103 MCG/ACT inhaler Inhale 2 puffs into the lungs every 4  (four) hours as needed for wheezing. 1 Inhaler 0   . alendronate (FOSAMAX) 70 MG tablet Take 70 mg by mouth once a week. Take with a full glass of water on an empty stomach.   Past Week at Unknown time  . aspirin EC 81 MG tablet Take 81 mg by mouth daily.   06/06/2014 at Unknown time  . cholecalciferol (VITAMIN D) 1000 UNITS tablet Take 2,000 Units by mouth daily.    06/06/2014 at Unknown time  . glimepiride (AMARYL) 2 MG tablet Take 2 mg by mouth daily.    06/06/2014 at Unknown time  . ketorolac (ACULAR) 0.4 % SOLN Apply 1 drop to eye 4 (four) times daily. PRIOR TO SURGERY   06/06/2014 at Unknown time  . levothyroxine (SYNTHROID, LEVOTHROID) 50 MCG tablet Take 50 mcg by mouth daily before breakfast.   06/06/2014 at Unknown time  . metFORMIN (GLUCOPHAGE) 500 MG tablet Take 500 mg by mouth 2 (two) times daily with a meal.   06/06/2014 at Unknown time  . metoprolol tartrate (LOPRESSOR) 25 MG tablet Take 25 mg by mouth 2 (two) times daily.   06/06/2014 at Unknown time  . Multiple Vitamins-Minerals (ICAPS) TABS Take 1 tablet by mouth daily.   06/06/2014 at Unknown time  . ofloxacin (OCUFLOX) 0.3 % ophthalmic solution Apply 1 drop to eye 4 (four) times daily. PRIOR TO SURGERY   06/06/2014 at Unknown time  . Omega-3 Fatty Acids (FISH OIL) 1200 MG CAPS Take 1 capsule by mouth daily.   06/06/2014 at Unknown time  . OMEGA-3 KRILL OIL PO Take 1 capsule by mouth daily.   06/06/2014 at Unknown time  . prednisoLONE acetate (PRED FORTE) 1 % ophthalmic suspension Apply 1 drop to eye 4 (four) times daily. AFTER SURGERY   06/06/2014 at Unknown time  . warfarin (COUMADIN) 2.5 MG tablet Take by mouth every evening.    06/05/2014 at Unknown time   Scheduled: . [START ON 06/08/2014] levofloxacin (LEVAQUIN) IV  750 mg Intravenous Q48H  . levothyroxine  50 mcg Oral QAC breakfast  . metoprolol tartrate  25 mg Oral BID  . Warfarin - Pharmacist Dosing Inpatient   Does not apply q1800  . [DISCONTINUED] sodium chloride   Intravenous STAT    Continuous:  PRN:  Assesment: She has community-acquired pneumonia and it looks somewhat atypical on x-ray. I think she is  on the appropriate antibiotic at this point. She has pancytopenia which may be related to the pneumonia or may be related to some sort of problem in her bone marrow and that is being worked up by Dr. Delbert Harness. Principal Problem:   CAP (community acquired pneumonia) Active Problems:   Atrial fibrillation   Chronic anticoagulation   Anemia   Frequent falls   Generalized weakness   Renal insufficiency, mild   Thrombocytopenia   Undiagnosed cardiac murmurs   Elevated bilirubin    Plan: I agree with current antibiotics. She's been worked up for atypical infections. No other changes today      Colene Mines L 06/07/2014, 8:53 AM

## 2014-06-07 NOTE — Evaluation (Signed)
Physical Therapy Evaluation Patient Details Name: Tracey Heath MRN: [id number] DOB: [birth year] Today's Date: 06/07/2014   History of Present Illness  Tracey Heath is a 81 y.o. female with PMHx of glaucoma, cataracts AFib, DM, carotid bruit (reported by pt), who presents to the Emergency Department complaining of weakness in bilateral hips and LEs with onset a month ago. Pt was sent to ED from PCP, Dr. Janna Arch, for evaluation. Pt notes dull and aching pain in area of bruise to left thigh with onset 2-3 weeks ago, intermittent swelling in legs for 1.5 months and SOB. Pt is on warfarin. Pt denies fever, back pain, chest pain, and abdominal pain.     Clinical Impression  Tracey Heath, 81 year old female, seen in bed awake, alert, oriented, pleasant, cooperative and able to follow directions well, is seen today for PT Evaluation and treatment. Patient reports 3 falls in the last week while getting out of bed: She slide off the bed and landed on her knees then fell back when attempting to regain balance and stand up on both incident. On Saturday night, patient recalled she slide out of the bed, unable to recall which part of the body landed first, was unable to stand up. Tracey Heath (son) found her in the floor that same night. Per Nursing report, Patient was unable to have enough sleep. It was then confirmed by patient herself. Patient demonstrated extreme generalized weakness and fatigue on both UE and LE. Patient tolerated well therapeutic exercises. Activities is limited by Fatigue. Patient's BP is flactuating from 129/46 mmHg in Supine prior to activity and drops to 119/49 mmHg in Supine at rest then 130/46 mmHg with HOB elevated 45-90 degrees. Nursing staff was notified. Patient reports nervous jerky feeling. Patient was able to perform supine to sit with Moderate Assist but unable to tolerate sitting position. Patient demonstrated max assist in bed mobility, scooting/bridging and moderate assist  in Supine to/from Sit. Due to safety concerns, attempted transfers and gait but unable to complete. ROM is WNL/WFL. PT recommendation:  OT referral was recommended due to decline in UE function.  To return home with HHPT vs. SNF per patient and family preference.    Follow Up Recommendations Home health PT       Recommendations for Other Services OT consult     Precautions / Restrictions Precautions Precautions:  (Per patient/family report, Patient has 3 falls in the same week prior to admission getting out of the bed) Restrictions Weight Bearing Restrictions: No      Mobility  Bed Mobility Overal bed mobility: Needs Assistance Bed Mobility: Supine to Sit   Supine to sit: Mod assist   Transfers  General transfer comment: due to safety concerns, Attempted but not completed.   Ambulation/Gait    General Gait Details: due to safety concerns, Attempted but not completed.      Balance Overall balance assessment: Needs assistance       Pertinent Vitals/Pain Pain Assessment: No/denies pain    Home Living Family/patient expects to be discharged to:: Private residence Living Arrangements: Alone (Youngest son lives close by and is readily available) Available Help at Discharge: Family;Available 24 hours/day (Per patient's report, Tracey Heath (son ) is readily available. [name] neighbor is assisting her out during the day and Granddaughter writes down her checks and mails ) Type of Home: House Home Access: Stairs to enter;Ramped entrance Entrance Stairs-Rails: Can reach both Entrance Stairs-Number of Steps: ramp entering the house and 13 step going up the second floor.  Home Layout: Two level;Able to live on main level with bedroom/bathroom (Laundry is the mail level) Home Equipment: Environmental consultantWalker - 4 wheels;Cane - single point;Bedside commode;Shower seat - built in;Tub bench;Grab bars - tub/shower;Grab bars - toilet;Hand held shower head;Wheelchair - manual (Grabber/Reacher and sock aid)       Prior Function Level of Independence: Needs assistance   Gait / Transfers Assistance Needed: walks with a walker with Stand by Assist from caregiver  ADL's / Homemaking Assistance Needed: Per patient report, In the last 2 weeks prior ro admission patient requires assistance from a lady neighbor in ADL such as bathing, dressing, etc, Granddaughter helps patient with mails and to write in checks and sign them.      Hand Dominance   Dominant Hand: Right    Extremity/Trunk Assessment   Upper Extremity Assessment: Defer to OT evaluation  Lower Extremity Assessment: Generalized weakness   Cervical / Trunk Assessment: Kyphotic    Communication   Communication: No difficulties  Cognition Arousal/Alertness: Awake/alert Behavior During Therapy: WFL for tasks assessed/performed Overall Cognitive Status: Within Functional Limits for tasks assessed      Exercises General Exercises - Lower Extremity Ankle Circles/Pumps: AROM;Both;10 reps;Supine Quad Sets: AROM;Both;10 reps;Supine Gluteal Sets: AROM;Both;10 reps;Supine Heel Slides: AAROM;Both;10 reps;Supine      Assessment/Plan    PT Assessment Patient needs continued PT services  PT Diagnosis Difficulty walking;Generalized weakness   PT Problem List Decreased strength;Decreased range of motion;Decreased activity tolerance;Decreased mobility  PT Treatment Interventions Functional mobility training;Therapeutic activities;Therapeutic exercise;Patient/family education;Gait training   PT Goals (Current goals can be found in the Care Plan section) Acute Rehab PT Goals PT Goal Formulation: With patient/family Time For Goal Achievement: 06/21/14 Potential to Achieve Goals: Good    Frequency Min 3X/week    End of Session Equipment Utilized During Treatment: Gait belt Activity Tolerance: Patient limited by fatigue Patient left: in bed;with call bell/phone within reach;with family/visitor present Tracey Heath (Son)) Nurse Communication:  Mobility status         Time: 1053-1202 PT Time Calculation (min) (ACUTE ONLY): 69 min   Charges:   PT Evaluation $Initial PT Evaluation Tier I: 1 Procedure PT Treatments $Therapeutic Exercise: 8-22 mins      Gladyse Corvin A 06/07/2014, 2:56 PM

## 2014-06-07 NOTE — Progress Notes (Signed)
Attempted to get urine sample from pt. Specimen mixed with stool, unable to obtain.

## 2014-06-08 ENCOUNTER — Encounter (HOSPITAL_COMMUNITY): Payer: Self-pay | Admitting: Cardiology

## 2014-06-08 DIAGNOSIS — I480 Paroxysmal atrial fibrillation: Secondary | ICD-10-CM

## 2014-06-08 DIAGNOSIS — I350 Nonrheumatic aortic (valve) stenosis: Secondary | ICD-10-CM

## 2014-06-08 DIAGNOSIS — J189 Pneumonia, unspecified organism: Principal | ICD-10-CM

## 2014-06-08 LAB — CBC
HCT: 22 % — ABNORMAL LOW (ref 36.0–46.0)
HEMOGLOBIN: 7.4 g/dL — AB (ref 12.0–15.0)
MCH: 32.7 pg (ref 26.0–34.0)
MCHC: 33.6 g/dL (ref 30.0–36.0)
MCV: 97.3 fL (ref 78.0–100.0)
Platelets: 81 10*3/uL — ABNORMAL LOW (ref 150–400)
RBC: 2.26 MIL/uL — AB (ref 3.87–5.11)
RDW: 17.1 % — ABNORMAL HIGH (ref 11.5–15.5)
WBC: 4.1 10*3/uL (ref 4.0–10.5)

## 2014-06-08 LAB — PROTIME-INR
INR: 2.94 — ABNORMAL HIGH (ref 0.00–1.49)
Prothrombin Time: 30.9 seconds — ABNORMAL HIGH (ref 11.6–15.2)

## 2014-06-08 LAB — STREP PNEUMONIAE URINARY ANTIGEN: Strep Pneumo Urinary Antigen: NEGATIVE

## 2014-06-08 LAB — RPR: RPR Ser Ql: NONREACTIVE

## 2014-06-08 LAB — TSH: TSH: 4.777 u[IU]/mL — ABNORMAL HIGH (ref 0.350–4.500)

## 2014-06-08 MED ORDER — LATANOPROST 0.005 % OP SOLN
1.0000 [drp] | Freq: Every day | OPHTHALMIC | Status: DC
  Administered 2014-06-08 – 2014-06-09 (×2): 1 [drp] via OPHTHALMIC
  Filled 2014-06-08: qty 2.5

## 2014-06-08 MED ORDER — BRIMONIDINE TARTRATE 0.15 % OP SOLN
1.0000 [drp] | Freq: Two times a day (BID) | OPHTHALMIC | Status: DC
  Administered 2014-06-08 – 2014-06-10 (×4): 1 [drp] via OPHTHALMIC
  Filled 2014-06-08: qty 5

## 2014-06-08 MED ORDER — SODIUM CHLORIDE 0.9 % IJ SOLN
3.0000 mL | Freq: Two times a day (BID) | INTRAMUSCULAR | Status: DC
  Administered 2014-06-08 – 2014-06-10 (×3): 3 mL via INTRAVENOUS

## 2014-06-08 MED ORDER — DORZOLAMIDE HCL-TIMOLOL MAL 2-0.5 % OP SOLN
1.0000 [drp] | Freq: Two times a day (BID) | OPHTHALMIC | Status: DC
  Administered 2014-06-08 – 2014-06-10 (×4): 1 [drp] via OPHTHALMIC
  Filled 2014-06-08: qty 10

## 2014-06-08 MED ORDER — FUROSEMIDE 20 MG PO TABS
20.0000 mg | ORAL_TABLET | Freq: Every day | ORAL | Status: DC
  Administered 2014-06-08 – 2014-06-10 (×3): 20 mg via ORAL
  Filled 2014-06-08 (×3): qty 1

## 2014-06-08 MED ORDER — SODIUM CHLORIDE 0.9 % IJ SOLN: 3.0000 mL | INTRAMUSCULAR | Status: DC | PRN

## 2014-06-08 MED ORDER — SODIUM CHLORIDE 0.9 % IV SOLN: 250.0000 mL | INTRAVENOUS | Status: DC | PRN

## 2014-06-08 MED ORDER — WARFARIN SODIUM 1 MG PO TABS
1.0000 mg | ORAL_TABLET | Freq: Once | ORAL | Status: AC
  Administered 2014-06-08: 1 mg via ORAL
  Filled 2014-06-08: qty 1

## 2014-06-08 NOTE — Clinical Documentation Improvement (Signed)
   Supporting Information: History of diabetes & hypotension Per H&P: Renal insufficiency, mild- Do not have baseline cr level. pcp is dr Janna Archdondiego, may have more insight on previous evaluation of her renal function 3/8 progress note by Dr. Janna ArchonDiego:  Renal insufficiency, mild 3/8 consult note:Renal insufficiency, mild  Component     Latest Ref Rng 06/06/2014 06/07/2014           BUN     6 - 23 mg/dL 35 (H) 35 (H)  Creatinine     0.50 - 1.10 mg/dL 1.611.57 (H) 0.961.46 (H)                              GFR calc non Af Amer     >90 mL/min 30 (L) 32 (L)   Treatment:  No continuous IV fluids Monitoring BMP daily x 3  Please clarify renal status Thank you.  Marland Kitchen.  Acute renal failure is due to: --Acute tubular necrosis (ATN) --Other (specify) . Be specific with documentation --Acute renal insufficiency and acute kidney disease are not reported as acute renal failure . Document any associated diagnoses/conditions AND/OR . Document the stage of CKD --Chronic kidney disease, stage 1- GFR > OR = 90 --Chronic kidney disease, stage 2 (mild) - GFR 60-89 --Chronic kidney disease, stage 3 (moderate) - GFR 30-59 --Chronic kidney disease, stage 4 (severe) - GFR 15-29 --Chronic kidney disease, stage 5- GFR < 15 --End-stage renal disease (ESRD) . Document any underlying cause of CKD such as Diabetes or Hypertension . Chronic renal failure without a documented stage will be assigned to Chronic kidney disease, unspecified . Document any associated diagnoses/conditions   Thank You,  Ranette Luckadoo T. Luiz OchoaWilliams RN, MSN, MBA/MHA Clinical Documentation Specialist [email or web address] Office # [phone]

## 2014-06-08 NOTE — Progress Notes (Signed)
Subjective: She says she feels a little better. She has no new complaints. She is coughing a little bit.  Objective: Vital signs in last 24 hours: Temp:  [98.1 F (36.7 C)-98.6 F (37 C)] 98.3 F (36.8 C) (03/09 0513) Pulse Rate:  [65-79] 70 (03/09 0513) Resp:  [18-21] 21 (03/09 0513) BP: (109-138)/(46-60) 109/60 mmHg (03/09 0513) SpO2:  [96 %-100 %] 96 % (03/09 0513) Weight change:  Last BM Date: 06/06/14  Intake/Output from previous day: 03/08 0701 - 03/09 0700 In: 480 [P.O.:480] Out: 300 [Urine:300]  PHYSICAL EXAM General appearance: alert, cooperative and mild distress Resp: She has less rales on the left than yesterday. She seems to have less dyspnea at rest than yesterday Cardio: Her heart is minimally irregular with a loud systolic murmur GI: soft, non-tender; bowel sounds normal; no masses,  no organomegaly Extremities: extremities normal, atraumatic, no cyanosis or edema  Lab Results:  Results for orders placed or performed during the hospital encounter of 06/06/14 (from the past 48 hour(s))  CBC  (at [facility])     Status: Abnormal   Collection Time: 06/06/14 12:13 PM  Result Value Ref Range   WBC 5.8 4.0 - 10.5 K/uL   RBC 2.80 (L) 3.87 - 5.11 MIL/uL   Hemoglobin 8.9 (L) 12.0 - 15.0 g/dL   HCT 27.4 (L) 36.0 - 46.0 %   MCV 97.9 78.0 - 100.0 fL   MCH 31.8 26.0 - 34.0 pg   MCHC 32.5 30.0 - 36.0 g/dL   RDW 17.2 (H) 11.5 - 15.5 %   Platelets 115 (L) 150 - 400 K/uL    Comment: SPECIMEN CHECKED FOR CLOTS PLATELET COUNT CONFIRMED BY SMEAR   Basic metabolic panel  (at [facility])     Status: Abnormal   Collection Time: 06/06/14 12:13 PM  Result Value Ref Range   Sodium 138 135 - 145 mmol/L   Potassium 4.5 3.5 - 5.1 mmol/L   Chloride 109 96 - 112 mmol/L   CO2 20 19 - 32 mmol/L   Glucose, Bld 157 (H) 70 - 99 mg/dL   BUN 35 (H) 6 - 23 mg/dL   Creatinine, Ser 1.57 (H) 0.50 - 1.10 mg/dL   Calcium 8.8 8.4 - 10.5 mg/dL   GFR calc non Af Amer 30  (L) >90 mL/min   GFR calc Af Amer 34 (L) >90 mL/min    Comment: (NOTE) The eGFR has been calculated using the CKD EPI equation. This calculation has not been validated in all clinical situations. eGFR's persistently <90 mL/min signify possible Chronic Kidney Disease.    Anion gap 9 5 - 15  Lipase, blood     Status: None   Collection Time: 06/06/14 12:13 PM  Result Value Ref Range   Lipase 25 11 - 59 U/L  Hepatic function panel     Status: Abnormal   Collection Time: 06/06/14 12:13 PM  Result Value Ref Range   Total Protein 6.4 6.0 - 8.3 g/dL   Albumin 2.6 (L) 3.5 - 5.2 g/dL   AST 29 0 - 37 U/L   ALT 18 0 - 35 U/L   Alkaline Phosphatase 49 39 - 117 U/L   Total Bilirubin 1.9 (H) 0.3 - 1.2 mg/dL   Bilirubin, Direct 0.6 (H) 0.0 - 0.5 mg/dL   Indirect Bilirubin 1.3 (H) 0.3 - 0.9 mg/dL  Protime-INR     Status: Abnormal   Collection Time: 06/06/14 12:13 PM  Result Value Ref Range   Prothrombin  Time 28.4 (H) 11.6 - 15.2 seconds   INR 2.64 (H) 0.00 - 1.49  Troponin I     Status: None   Collection Time: 06/06/14 12:13 PM  Result Value Ref Range   Troponin I <0.03 <0.031 ng/mL    Comment:        NO INDICATION OF MYOCARDIAL INJURY.   Urinalysis, Routine w reflex microscopic     Status: Abnormal   Collection Time: 06/06/14  5:21 PM  Result Value Ref Range   Color, Urine YELLOW YELLOW   APPearance CLEAR CLEAR   Specific Gravity, Urine 1.020 1.005 - 1.030   pH 5.5 5.0 - 8.0   Glucose, UA NEGATIVE NEGATIVE mg/dL   Hgb urine dipstick NEGATIVE NEGATIVE   Bilirubin Urine SMALL (A) NEGATIVE   Ketones, ur NEGATIVE NEGATIVE mg/dL   Protein, ur NEGATIVE NEGATIVE mg/dL   Urobilinogen, UA 0.2 0.0 - 1.0 mg/dL   Nitrite NEGATIVE NEGATIVE   Leukocytes, UA NEGATIVE NEGATIVE    Comment: MICROSCOPIC NOT DONE ON URINES WITH NEGATIVE PROTEIN, BLOOD, LEUKOCYTES, NITRITE, OR GLUCOSE <1000 mg/dL.  Brain natriuretic peptide     Status: Abnormal   Collection Time: 06/06/14  9:27 PM  Result Value Ref  Range   B Natriuretic Peptide 623.0 (H) 0.0 - 100.0 pg/mL  Culture, blood (routine x 2) Call MD if unable to obtain prior to antibiotics being given     Status: None (Preliminary result)   Collection Time: 06/06/14  9:27 PM  Result Value Ref Range   Specimen Description LEFT ANTECUBITAL    Special Requests BOTTLES DRAWN AEROBIC AND ANAEROBIC 6CC EACH    Culture NO GROWTH 1 DAY    Report Status PENDING   Culture, blood (routine x 2) Call MD if unable to obtain prior to antibiotics being given     Status: None (Preliminary result)   Collection Time: 06/06/14  9:27 PM  Result Value Ref Range   Specimen Description LEFT ANTECUBITAL    Special Requests BOTTLES DRAWN AEROBIC AND ANAEROBIC 4CC EACH    Culture NO GROWTH 1 DAY    Report Status PENDING   Lactic acid, plasma     Status: Abnormal   Collection Time: 06/06/14  9:27 PM  Result Value Ref Range   Lactic Acid, Venous 2.5 (HH) 0.5 - 2.0 mmol/L    Comment: RESULT REPEATED AND VERIFIED CRITICAL RESULT CALLED TO, READ BACK BY AND VERIFIED WITH: HAMILTON,S AT 2235 ON 06/06/2014 BY ISLEY,B   TSH     Status: None   Collection Time: 06/06/14  9:27 PM  Result Value Ref Range   TSH 4.197 0.350 - 4.500 uIU/mL  Lactic acid, plasma     Status: Abnormal   Collection Time: 06/07/14 12:03 AM  Result Value Ref Range   Lactic Acid, Venous 2.5 (HH) 0.5 - 2.0 mmol/L    Comment: CRITICAL VALUE NOTED.  VALUE IS CONSISTENT WITH PREVIOUSLY REPORTED AND CALLED VALUE.  Influenza panel by pcr     Status: None   Collection Time: 06/07/14 12:50 AM  Result Value Ref Range   Influenza A By PCR NEGATIVE NEGATIVE   Influenza B By PCR NEGATIVE NEGATIVE   H1N1 flu by pcr NOT DETECTED NOT DETECTED    Comment:        The Xpert Flu assay (FDA approved for nasal aspirates or washes and nasopharyngeal swab specimens), is intended as an aid in the diagnosis of influenza and should not be used as a sole basis for treatment.  Comprehensive metabolic panel      Status: Abnormal   Collection Time: 06/07/14  6:48 AM  Result Value Ref Range   Sodium 139 135 - 145 mmol/L   Potassium 4.5 3.5 - 5.1 mmol/L   Chloride 112 96 - 112 mmol/L   CO2 21 19 - 32 mmol/L   Glucose, Bld 123 (H) 70 - 99 mg/dL   BUN 35 (H) 6 - 23 mg/dL   Creatinine, Ser 1.46 (H) 0.50 - 1.10 mg/dL   Calcium 8.2 (L) 8.4 - 10.5 mg/dL   Total Protein 5.4 (L) 6.0 - 8.3 g/dL   Albumin 2.1 (L) 3.5 - 5.2 g/dL   AST 22 0 - 37 U/L   ALT 15 0 - 35 U/L   Alkaline Phosphatase 40 39 - 117 U/L   Total Bilirubin 1.5 (H) 0.3 - 1.2 mg/dL   GFR calc non Af Amer 32 (L) >90 mL/min   GFR calc Af Amer 37 (L) >90 mL/min    Comment: (NOTE) The eGFR has been calculated using the CKD EPI equation. This calculation has not been validated in all clinical situations. eGFR's persistently <90 mL/min signify possible Chronic Kidney Disease.    Anion gap 6 5 - 15  CBC WITH DIFFERENTIAL     Status: Abnormal   Collection Time: 06/07/14  6:48 AM  Result Value Ref Range   WBC 3.8 (L) 4.0 - 10.5 K/uL   RBC 2.34 (L) 3.87 - 5.11 MIL/uL   Hemoglobin 7.6 (L) 12.0 - 15.0 g/dL   HCT 22.6 (L) 36.0 - 46.0 %   MCV 96.6 78.0 - 100.0 fL   MCH 32.5 26.0 - 34.0 pg   MCHC 33.6 30.0 - 36.0 g/dL   RDW 17.1 (H) 11.5 - 15.5 %   Platelets 85 (L) 150 - 400 K/uL    Comment: DELTA CHECK NOTED RESULT REPEATED AND VERIFIED SPECIMEN CHECKED FOR CLOTS PLATELET COUNT CONFIRMED BY SMEAR    Neutrophils Relative % 66 43 - 77 %   Neutro Abs 2.5 1.7 - 7.7 K/uL   Lymphocytes Relative 22 12 - 46 %   Lymphs Abs 0.8 0.7 - 4.0 K/uL   Monocytes Relative 10 3 - 12 %   Monocytes Absolute 0.4 0.1 - 1.0 K/uL   Eosinophils Relative 2 0 - 5 %   Eosinophils Absolute 0.1 0.0 - 0.7 K/uL   Basophils Relative 0 0 - 1 %   Basophils Absolute 0.0 0.0 - 0.1 K/uL   Smear Review PLATELETS APPEAR DECREASED   Protime-INR     Status: Abnormal   Collection Time: 06/07/14  6:48 AM  Result Value Ref Range   Prothrombin Time 32.5 (H) 11.6 - 15.2 seconds    INR 3.14 (H) 0.00 - 1.49  Vitamin B12     Status: Abnormal   Collection Time: 06/07/14  6:48 AM  Result Value Ref Range   Vitamin B-12 1030 (H) 211 - 911 pg/mL    Comment: Performed at [facility]  Folate     Status: None   Collection Time: 06/07/14  6:48 AM  Result Value Ref Range   Folate 10.8 ng/mL    Comment: (NOTE) Reference Ranges        Deficient:       0.4 - 3.3 ng/mL        Indeterminate:   3.4 - 5.4 ng/mL        Normal:              >  5.4 ng/mL Performed at [facility]   Iron and TIBC     Status: Abnormal   Collection Time: 06/07/14  6:48 AM  Result Value Ref Range   Iron 62 42 - 145 ug/dL   TIBC 133 (L) 250 - 470 ug/dL   Saturation Ratios 47 20 - 55 %   UIBC 71 (L) 125 - 400 ug/dL    Comment: Performed at [facility]  Ferritin     Status: None   Collection Time: 06/07/14  6:48 AM  Result Value Ref Range   Ferritin 144 10 - 291 ng/mL    Comment: Performed at [facility]  Reticulocytes     Status: Abnormal   Collection Time: 06/07/14  6:48 AM  Result Value Ref Range   Retic Ct Pct 1.8 0.4 - 3.1 %   RBC. 2.34 (L) 3.87 - 5.11 MIL/uL   Retic Count, Manual 42.1 19.0 - 186.0 K/uL  RPR     Status: None   Collection Time: 06/07/14  6:48 AM  Result Value Ref Range   RPR Ser Ql Non Reactive Non Reactive    Comment: (NOTE) Performed At: [facility] [place], [place] [id number] Lindon Romp MD UV:[id number]   Sedimentation rate     Status: Abnormal   Collection Time: 06/07/14  6:48 AM  Result Value Ref Range   Sed Rate 35 (H) 0 - 22 mm/hr  TSH     Status: None   Collection Time: 06/07/14  6:48 AM  Result Value Ref Range   TSH 3.943 0.350 - 4.500 uIU/mL  Prealbumin     Status: Abnormal   Collection Time: 06/07/14  6:48 AM  Result Value Ref Range   Prealbumin 3.8 (L) 17.0 - 34.0 mg/dL    Comment: Performed at [facility]  Protime-INR     Status: Abnormal   Collection Time: 06/08/14  6:14  AM  Result Value Ref Range   Prothrombin Time 30.9 (H) 11.6 - 15.2 seconds   INR 2.94 (H) 0.00 - 1.49  CBC     Status: Abnormal   Collection Time: 06/08/14  6:14 AM  Result Value Ref Range   WBC 4.1 4.0 - 10.5 K/uL   RBC 2.26 (L) 3.87 - 5.11 MIL/uL   Hemoglobin 7.4 (L) 12.0 - 15.0 g/dL   HCT 22.0 (L) 36.0 - 46.0 %   MCV 97.3 78.0 - 100.0 fL   MCH 32.7 26.0 - 34.0 pg   MCHC 33.6 30.0 - 36.0 g/dL   RDW 17.1 (H) 11.5 - 15.5 %   Platelets 81 (L) 150 - 400 K/uL    Comment: SPECIMEN CHECKED FOR CLOTS CONSISTENT WITH PREVIOUS RESULT     ABGS No results for input(s): PHART, PO2ART, TCO2, HCO3 in the last 72 hours.  Invalid input(s): PCO2 CULTURES Recent Results (from the past 240 hour(s))  Culture, blood (routine x 2) Call MD if unable to obtain prior to antibiotics being given     Status: None (Preliminary result)   Collection Time: 06/06/14  9:27 PM  Result Value Ref Range Status   Specimen Description LEFT ANTECUBITAL  Final   Special Requests BOTTLES DRAWN AEROBIC AND ANAEROBIC [place]  Final   Culture NO GROWTH 1 DAY  Final   Report Status PENDING  Incomplete  Culture, blood (routine x 2) Call MD if unable to obtain prior to antibiotics being given     Status: None (Preliminary result)   Collection Time:  06/06/14  9:27 PM  Result Value Ref Range Status   Specimen Description LEFT ANTECUBITAL  Final   Special Requests BOTTLES DRAWN AEROBIC AND ANAEROBIC 4CC EACH  Final   Culture NO GROWTH 1 DAY  Final   Report Status PENDING  Incomplete   Studies/Results: Dg Chest 1 View  06/06/2014   CLINICAL DATA:  Status post fall 3 times over the weekend  EXAM: CHEST  1 VIEW  COMPARISON:  December 16, 2011  FINDINGS: The mediastinal contour is normal. The heart size is probably enlarged. There is patchy consolidation of medial left lung base. There is no pulmonary edema or pleural effusion. There is chronic deformity of the lateral right fourth rib. There is deformity of the lateral right  fifth rib, acute fracture is not excluded if the patient has focal pain here. There is no pneumothorax.  IMPRESSION: Patchy consolidation of medial left lung base suspicious for pneumonia.  Deformity of the lateral right fifth rib, acute fracture is not excluded if the patient has focal pain here.   Electronically Signed   By: Abelardo Diesel M.D.   On: 06/06/2014 17:20   Ct Head Wo Contrast  06/06/2014   CLINICAL DATA:  81 year old female with weakness  EXAM: CT HEAD WITHOUT CONTRAST  TECHNIQUE: Contiguous axial images were obtained from the base of the skull through the vertex without intravenous contrast.  COMPARISON:  Prior CT scan of the head and cervical spine 12/13/2011  FINDINGS: Negative for acute intracranial hemorrhage, acute infarction, mass, mass effect, hydrocephalus or midline shift. Gray-white differentiation is preserved throughout. Relatively mild cortical atrophy for age. Mild periventricular and and subcortical white matter hypoattenuation consistent with chronic microvascular ischemic white matter disease is also similar in unchanged compared to prior. No focal soft tissue or calvarial abnormality. Bilateral globes and orbits are symmetric bilaterally. Normal aeration of the mastoid air cells and visualized paranasal sinuses. Atherosclerotic calcification in both cavernous carotid arteries. Hyperostosis frontalis interna noted incidentally. Stable small lucency in the right parieto-occipital calvarium dating back to 2013 and therefore likely benign.  IMPRESSION: 1. No acute intracranial abnormality. 2. Stable mild atrophy and chronic microvascular ischemic white matter disease.   Electronically Signed   By: Jacqulynn Cadet M.D.   On: 06/06/2014 16:54   Dg Hips Bilat With Pelvis 3-4 Views  06/06/2014   CLINICAL DATA:  Multiple falls.  Bilateral hip tenderness.  EXAM: BILATERAL HIP (WITH PELVIS) 3-4 VIEWS  COMPARISON:  None.  FINDINGS: Mild symmetric degenerative changes in the hips  bilaterally. No acute bony abnormality. Specifically, no fracture, subluxation, or dislocation. Soft tissues are intact.  IMPRESSION: No acute bony abnormality.   Electronically Signed   By: Rolm Baptise M.D.   On: 06/06/2014 16:59    Medications:  Prior to Admission:  Prescriptions prior to admission  Medication Sig Dispense Refill Last Dose  . albuterol-ipratropium (COMBIVENT) 18-103 MCG/ACT inhaler Inhale 2 puffs into the lungs every 4 (four) hours as needed for wheezing. 1 Inhaler 0   . alendronate (FOSAMAX) 70 MG tablet Take 70 mg by mouth once a week. Take with a full glass of water on an empty stomach.   Past Week at Unknown time  . aspirin EC 81 MG tablet Take 81 mg by mouth daily.   06/06/2014 at Unknown time  . cholecalciferol (VITAMIN D) 1000 UNITS tablet Take 2,000 Units by mouth daily.    06/06/2014 at Unknown time  . glimepiride (AMARYL) 2 MG tablet Take 2 mg by mouth daily.  06/06/2014 at Unknown time  . ketorolac (ACULAR) 0.4 % SOLN Apply 1 drop to eye 4 (four) times daily. PRIOR TO SURGERY   06/06/2014 at Unknown time  . levothyroxine (SYNTHROID, LEVOTHROID) 50 MCG tablet Take 50 mcg by mouth daily before breakfast.   06/06/2014 at Unknown time  . metFORMIN (GLUCOPHAGE) 500 MG tablet Take 500 mg by mouth 2 (two) times daily with a meal.   06/06/2014 at Unknown time  . metoprolol tartrate (LOPRESSOR) 25 MG tablet Take 25 mg by mouth 2 (two) times daily.   06/06/2014 at Unknown time  . Multiple Vitamins-Minerals (ICAPS) TABS Take 1 tablet by mouth daily.   06/06/2014 at Unknown time  . ofloxacin (OCUFLOX) 0.3 % ophthalmic solution Apply 1 drop to eye 4 (four) times daily. PRIOR TO SURGERY   06/06/2014 at Unknown time  . Omega-3 Fatty Acids (FISH OIL) 1200 MG CAPS Take 1 capsule by mouth daily.   06/06/2014 at Unknown time  . OMEGA-3 KRILL OIL PO Take 1 capsule by mouth daily.   06/06/2014 at Unknown time  . prednisoLONE acetate (PRED FORTE) 1 % ophthalmic suspension Apply 1 drop to eye 4 (four) times  daily. AFTER SURGERY   06/06/2014 at Unknown time  . warfarin (COUMADIN) 2.5 MG tablet Take by mouth every evening.    06/05/2014 at Unknown time   Scheduled: . furosemide  20 mg Oral Daily  . levofloxacin (LEVAQUIN) IV  750 mg Intravenous Q48H  . levothyroxine  50 mcg Oral QAC breakfast  . metoprolol tartrate  25 mg Oral BID  . Warfarin - Pharmacist Dosing Inpatient   Does not apply Q24H   Continuous:  PRN:  Assesment: She was admitted with community-acquired pneumonia. She has chronic atrial fibrillation and is chronically anticoagulated. She has aortic stenosis and that is apparently giving her more trouble. She has been weaker than previously. She has pancytopenia and her white blood count has now come up to 4100 but hemoglobin and platelet count of both decreased a little bit. There is no obvious cause of that on her anemia profile. Her albumin level is 2.1 so she is clearly malnourished as well Principal Problem:   CAP (community acquired pneumonia) Active Problems:   Atrial fibrillation   Chronic anticoagulation   Anemia   Frequent falls   Generalized weakness   Renal insufficiency, mild   Thrombocytopenia   Undiagnosed cardiac murmurs   Elevated bilirubin    Plan: Continue treatments. It appears that her pneumonia is improving.    LOS: 1 day   Andreanna Mikolajczak L 06/08/2014, 8:16 AM

## 2014-06-08 NOTE — Progress Notes (Signed)
Echo not performed we done this morning will add Lasix 20 mg by mouth daily for gentle volume reduction 3 electrolytes daily continue with IV Levaquin and nebulizer therapy for infiltrate physical therapy saw patient yesterday Tracey Heath ZOX:[id number] DOB: December 15, 1929 DOA: 06/06/2014 PCP: Isabella StallingNDIEGO,Marlow Berenguer M, MD             Physical Exam: Blood pressure 109/60, pulse 70, temperature 98.3 F (36.8 C), temperature source Oral, resp. rate 21, height 5\' 8"  (1.727 m), weight 192 lb (87.091 kg), SpO2 96 %. lungs diminished breath sounds in the bases no rales appreciable heart regular rhythm, 3/6 systolic ejection murmur which is chronic o S3-S4 no heaves thrills rubs or chronic edema   Investigations:  Recent Results (from the past 240 hour(s))  Culture, blood (routine x 2) Call MD if unable to obtain prior to antibiotics being given     Status: None (Preliminary result)   Collection Time: 06/06/14  9:27 PM  Result Value Ref Range Status   Specimen Description LEFT ANTECUBITAL  Final   Special Requests BOTTLES DRAWN AEROBIC AND ANAEROBIC [facility]6CC EACH  Final   Culture NO GROWTH 1 DAY  Final   Report Status PENDING  Incomplete  Culture, blood (routine x 2) Call MD if unable to obtain prior to antibiotics being given     Status: None (Preliminary result)   Collection Time: 06/06/14  9:27 PM  Result Value Ref Range Status   Specimen Description LEFT ANTECUBITAL  Final   Special Requests BOTTLES DRAWN AEROBIC AND ANAEROBIC 4CC EACH  Final   Culture NO GROWTH 1 DAY  Final   Report Status PENDING  Incomplete     Basic Metabolic Panel:  Recent Labs  81/19/1401/10/14 1213 06/07/14 0648  NA 138 139  K 4.5 4.5  CL 109 112  CO2 20 21  GLUCOSE 157* 123*  BUN 35* 35*  CREATININE 1.57* 1.46*  CALCIUM 8.8 8.2*   Liver Function Tests:  Recent Labs  06/06/14 1213 06/07/14 0648  AST 29 22  ALT 18 15  ALKPHOS 49 40  BILITOT 1.9* 1.5*  PROT 6.4 5.4*  ALBUMIN 2.6* 2.1*      CBC:  Recent Labs  06/06/14 1213 06/07/14 0648  WBC 5.8 3.8*  NEUTROABS  --  2.5  HGB 8.9* 7.6*  HCT 27.4* 22.6*  MCV 97.9 96.6  PLT 115* 85*    Dg Chest 1 View  06/06/2014   CLINICAL DATA:  Status post fall 3 times over the weekend  EXAM: CHEST  1 VIEW  COMPARISON:  December 16, 2011  FINDINGS: The mediastinal contour is normal. The heart size is probably enlarged. There is patchy consolidation of medial left lung base. There is no pulmonary edema or pleural effusion. There is chronic deformity of the lateral right fourth rib. There is deformity of the lateral right fifth rib, acute fracture is not excluded if the patient has focal pain here. There is no pneumothorax.  IMPRESSION: Patchy consolidation of medial left lung base suspicious for pneumonia.  Deformity of the lateral right fifth rib, acute fracture is not excluded if the patient has focal pain here.   Electronically Signed   By: Sherian ReinWei-Chen  Lin M.D.   On: 06/06/2014 17:20   Ct Head Wo Contrast  06/06/2014   CLINICAL DATA:  81 year old female with weakness  EXAM: CT HEAD WITHOUT CONTRAST  TECHNIQUE: Contiguous axial images were obtained from the base of the skull through the vertex without intravenous contrast.  COMPARISON:  Prior  CT scan of the head and cervical spine 12/13/2011  FINDINGS: Negative for acute intracranial hemorrhage, acute infarction, mass, mass effect, hydrocephalus or midline shift. Gray-white differentiation is preserved throughout. Relatively mild cortical atrophy for age. Mild periventricular and and subcortical white matter hypoattenuation consistent with chronic microvascular ischemic white matter disease is also similar in unchanged compared to prior. No focal soft tissue or calvarial abnormality. Bilateral globes and orbits are symmetric bilaterally. Normal aeration of the mastoid air cells and visualized paranasal sinuses. Atherosclerotic calcification in both cavernous carotid arteries. Hyperostosis  frontalis interna noted incidentally. Stable small lucency in the right parieto-occipital calvarium dating back to 2013 and therefore likely benign.  IMPRESSION: 1. No acute intracranial abnormality. 2. Stable mild atrophy and chronic microvascular ischemic white matter disease.   Electronically Signed   By: Tracey Heath M.D.   On: 06/06/2014 16:54   Dg Hips Bilat With Pelvis 3-4 Views  06/06/2014   CLINICAL DATA:  Multiple falls.  Bilateral hip tenderness.  EXAM: BILATERAL HIP (WITH PELVIS) 3-4 VIEWS  COMPARISON:  None.  FINDINGS: Mild symmetric degenerative changes in the hips bilaterally. No acute bony abnormality. Specifically, no fracture, subluxation, or dislocation. Soft tissues are intact.  IMPRESSION: No acute bony abnormality.   Electronically Signed   By: Tracey Heath M.D.   On: 06/06/2014 16:59      Medications  Impression: Chronic aortic stenosis now symptomatic  Principal Problem:   CAP (community acquired pneumonia) Active Problems:   Atrial fibrillation   Chronic anticoagulation   Anemia   Frequent falls   Generalized weakness   Renal insufficiency, mild   Thrombocytopenia   Undiagnosed cardiac murmurs   Elevated bilirubin     Plan: Reticulocyte order 2-D echo ordered cardiology consultation ordered for medical management of aortic stenosis patient has refused surgery several years ago continue IV Levaquin nebulizer therapy and physical therapy. Discontinue Coumadin  Consultants: Cardiology requested pulmonology   Procedures   Antibiotics: IV Levaquin 750 daily                  Code Status: No code  Family Communication: [place] with son  Disposition Plan 2-D echo cardiology consultation Lasix 20 mg by mouth daily added monitor electrolytes renal function and continue IV Levaquin and nebulizer therapy  Time spent: 30 minutes   LOS: 1 day   Tracey Heath M   06/08/2014, 7:07 AM
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
## 2014-06-09 DIAGNOSIS — D696 Thrombocytopenia, unspecified: Secondary | ICD-10-CM

## 2014-06-09 DIAGNOSIS — D649 Anemia, unspecified: Secondary | ICD-10-CM

## 2014-06-09 DIAGNOSIS — K7460 Unspecified cirrhosis of liver: Secondary | ICD-10-CM

## 2014-06-09 DIAGNOSIS — D638 Anemia in other chronic diseases classified elsewhere: Secondary | ICD-10-CM

## 2014-06-09 DIAGNOSIS — D72819 Decreased white blood cell count, unspecified: Secondary | ICD-10-CM

## 2014-06-09 DIAGNOSIS — D61818 Other pancytopenia: Secondary | ICD-10-CM

## 2014-06-09 LAB — CBC WITH DIFFERENTIAL/PLATELET
BASOS ABS: 0 10*3/uL (ref 0.0–0.1)
BASOS PCT: 1 % (ref 0–1)
Eosinophils Absolute: 0.1 10*3/uL (ref 0.0–0.7)
Eosinophils Relative: 4 % (ref 0–5)
HCT: 21.2 % — ABNORMAL LOW (ref 36.0–46.0)
HEMOGLOBIN: 7.1 g/dL — AB (ref 12.0–15.0)
Lymphocytes Relative: 21 % (ref 12–46)
Lymphs Abs: 0.7 10*3/uL (ref 0.7–4.0)
MCH: 32.4 pg (ref 26.0–34.0)
MCHC: 33.5 g/dL (ref 30.0–36.0)
MCV: 96.8 fL (ref 78.0–100.0)
MONO ABS: 0.4 10*3/uL (ref 0.1–1.0)
MONOS PCT: 13 % — AB (ref 3–12)
NEUTROS ABS: 2.1 10*3/uL (ref 1.7–7.7)
NEUTROS PCT: 62 % (ref 43–77)
PLATELETS: 77 10*3/uL — AB (ref 150–400)
RBC: 2.19 MIL/uL — AB (ref 3.87–5.11)
RDW: 17 % — ABNORMAL HIGH (ref 11.5–15.5)
Smear Review: DECREASED
WBC: 3.4 10*3/uL — ABNORMAL LOW (ref 4.0–10.5)

## 2014-06-09 LAB — LEGIONELLA ANTIGEN, URINE

## 2014-06-09 LAB — BASIC METABOLIC PANEL
ANION GAP: 3 — AB (ref 5–15)
BUN: 30 mg/dL — ABNORMAL HIGH (ref 6–23)
CHLORIDE: 113 mmol/L — AB (ref 96–112)
CO2: 23 mmol/L (ref 19–32)
CREATININE: 1.47 mg/dL — AB (ref 0.50–1.10)
Calcium: 8 mg/dL — ABNORMAL LOW (ref 8.4–10.5)
GFR calc non Af Amer: 32 mL/min — ABNORMAL LOW (ref >90)
GFR, EST AFRICAN AMERICAN: 37 mL/min — AB (ref >90)
GLUCOSE: 146 mg/dL — AB (ref 70–99)
POTASSIUM: 4.3 mmol/L (ref 3.5–5.1)
Sodium: 139 mmol/L (ref 135–145)

## 2014-06-09 LAB — RETICULOCYTES
RBC.: 2.29 MIL/uL — ABNORMAL LOW (ref 3.87–5.11)
RBC.: 2.34 MIL/uL — ABNORMAL LOW (ref 3.87–5.11)
RETIC COUNT ABSOLUTE: 49.1 10*3/uL (ref 19.0–186.0)
RETIC CT PCT: 2.1 % (ref 0.4–3.1)
Retic Count, Absolute: 38.9 10*3/uL (ref 19.0–186.0)
Retic Ct Pct: 1.7 % (ref 0.4–3.1)

## 2014-06-09 LAB — PREPARE RBC (CROSSMATCH)

## 2014-06-09 LAB — ABO/RH: ABO/RH(D): A POS

## 2014-06-09 LAB — IRON AND TIBC
IRON: 50 ug/dL (ref 42–145)
Saturation Ratios: 39 % (ref 20–55)
TIBC: 129 ug/dL — ABNORMAL LOW (ref 250–470)
UIBC: 79 ug/dL — ABNORMAL LOW (ref 125–400)

## 2014-06-09 LAB — PROTIME-INR
INR: 2.86 — AB (ref 0.00–1.49)
PROTHROMBIN TIME: 30.2 s — AB (ref 11.6–15.2)

## 2014-06-09 LAB — VITAMIN B12: VITAMIN B 12: 922 pg/mL — AB (ref 211–911)

## 2014-06-09 LAB — FERRITIN: FERRITIN: 162 ng/mL (ref 10–291)

## 2014-06-09 LAB — FOLATE: FOLATE: 8 ng/mL

## 2014-06-09 MED ORDER — WARFARIN SODIUM 1 MG PO TABS: 1.0000 mg | ORAL_TABLET | Freq: Once | ORAL | Status: DC

## 2014-06-09 MED ORDER — BISACODYL 5 MG PO TBEC
5.0000 mg | DELAYED_RELEASE_TABLET | Freq: Two times a day (BID) | ORAL | Status: DC | PRN
  Administered 2014-06-09: 5 mg via ORAL
  Filled 2014-06-09: qty 1

## 2014-06-09 MED ORDER — SODIUM CHLORIDE 0.9 % IV SOLN: Freq: Once | INTRAVENOUS | Status: DC

## 2014-06-09 MED ORDER — LEVOFLOXACIN 750 MG PO TABS
750.0000 mg | ORAL_TABLET | ORAL | Status: DC
  Administered 2014-06-09: 750 mg via ORAL
  Filled 2014-06-09: qty 1

## 2014-06-09 NOTE — Clinical Social Work Placement (Signed)
Clinical Social Work Department CLINICAL SOCIAL WORK PLACEMENT NOTE 06/09/2014  Patient:  Inda CokeBROWN,Doloris M  Account Number:  [id number] Admit date:  06/06/2014  Clinical Social Worker:  Derenda FennelKARA [place] Leer, LCSW  Date/time:  06/09/2014 11:12 AM  Clinical Social Work is seeking post-discharge placement for this patient at the following level of care:   SKILLED NURSING   (*CSW will update this form in Epic as items are completed)   06/09/2014  [facility]family provided with [facility]'s list of facilities offering this level of care within the geographic area requested by the patient (or if unable, by the patient's family).  06/09/2014  Patient/family informed of their freedom to choose among providers that offer the needed level of care, that participate in Medicare, Medicaid or managed care program needed by the patient, have an available bed and are willing to accept the patient.  06/09/2014  Patient/family informed of [facility]' ownership interest in [facility], as well as of the fact that they are under no obligation to receive care at this facility.  PASARR submitted to EDS on  PASARR number received on   FL2 transmitted to all facilities in geographic area requested by pt/family on  06/09/2014 FL2 transmitted to all facilities within larger geographic area on   Patient informed that his/her managed care company has contracts with or will negotiate with  certain facilities, including the following:     Patient/family informed of bed offers received:   Patient chooses bed at  Physician recommends and patient chooses bed at    Patient to be transferred to  on   Patient to be transferred to facility by  Patient and family notified of transfer on  Name of family member notified:    The following physician request were entered in Epic:   Additional Comments: Pt has existing pasarr.   Derenda FennelKara Hodges [place], [place]LCSW [phone]

## 2014-06-09 NOTE — Progress Notes (Signed)
Physical Therapy Treatment Patient Details Name: Tracey Heath MRN: [id number] DOB: [birth year] Today's Date: 06/09/2014    History of Present Illness Tracey Heath is a 81 y.o. female with PMHx of glaucoma, cataracts AFib, DM, carotid bruit (reported by pt), who presents to the Emergency Department complaining of weakness in bilateral hips and LEs with onset a month ago. Pt was sent to ED from PCP, Dr. Janna Archondiego, for evaluation. Pt notes dull and aching pain in area of bruise to left thigh with onset 2-3 weeks ago, intermittent swelling in legs for 1.5 months and SOB. Pt is on warfarin. Pt denies fever, back pain, chest pain, and abdominal pain.     PT Comments    Pt was found to be mildly lethargic but very cooperative.  No pain but feels weak.  She tolerated therapeutic exercise well, mod assist to assume sitting.  She tolerated sitting well, min assist to stand to a walker.  She was able to ambulate 5212' with a walker with close guarding.  She is up in a chair.  I am strongly recommending SNF at d/c.  She lives alone and has good rehab potential.     Follow Up Recommendations  SNF     Equipment Recommendations  None recommended by PT    Recommendations for Other Services OT consult     Precautions / Restrictions Precautions Precautions: Fall Restrictions Weight Bearing Restrictions: No    Mobility  Bed Mobility Overal bed mobility: Needs Assistance Bed Mobility: Supine to Sit     Supine to sit: Mod assist     General bed mobility comments: able to move LEs in the bed but unable to move trunk  Transfers Overall transfer level: Needs assistance Equipment used: Rolling walker (2 wheeled) Transfers: Sit to/from Stand Sit to Stand: Min assist            Ambulation/Gait Ambulation/Gait assistance: Min guard Ambulation Distance (Feet): 12 Feet Assistive device: Rolling walker (2 wheeled) Gait Pattern/deviations: Trunk flexed;Shuffle   Gait velocity  interpretation: Below normal speed for age/gender     Stairs            Wheelchair Mobility    Modified Rankin (Stroke Patients Only)       Balance   Sitting-balance support: No upper extremity supported;Feet supported Sitting balance-Leahy Scale: Good     Standing balance support: Bilateral upper extremity supported;During functional activity Standing balance-Leahy Scale: Fair                      Cognition Arousal/Alertness: Lethargic Behavior During Therapy: WFL for tasks assessed/performed Overall Cognitive Status: Within Functional Limits for tasks assessed                      Exercises General Exercises - Lower Extremity Ankle Circles/Pumps: AROM;10 reps;Supine;Both Quad Sets: AROM;Both;10 reps;Supine Gluteal Sets: AROM;Both;10 reps;Supine Short Arc Quad: AAROM;Both;10 reps;Supine Heel Slides: AAROM;Both;10 reps;Supine Hip ABduction/ADduction: AAROM;Both;10 reps;Supine    General Comments        Pertinent Vitals/Pain Pain Assessment: No/denies pain    Home Living                      Prior Function            PT Goals (current goals can now be found in the care plan section) Progress towards PT goals: Progressing toward goals    Frequency  Min 3X/week    PT Plan Discharge plan needs  to be updated    Co-evaluation             End of Session Equipment Utilized During Treatment: Gait belt Activity Tolerance: Patient tolerated treatment well Patient left: in chair;with call bell/phone within reach;with chair alarm set     Time: 1010-1034 PT Time Calculation (min) (ACUTE ONLY): 24 min  Charges:  $Gait Training: 8-22 mins $Therapeutic Exercise: 8-22 mins                    G Codes:      Cody, Oliger 06-12-2014, 10:44 AM

## 2014-06-09 NOTE — Consult Note (Signed)
Referral to hematology for "chronic anemia" is received.    Chart is reviewed.     Anemia has been ongoing since at least 2013 according to EMR.  Hgb in 2013 was as follows: Results for Tracey Heath, Tracey Heath (MRN [id number]) as of 06/09/2014 11:32  Ref. Range 07/14/2011 01:20 07/14/2011 01:38 12/13/2011 14:10 12/13/2011 14:48 12/14/2011 06:40 12/15/2011 07:29 12/16/2011 06:06 12/17/2011 08:28 12/20/2011 05:50  Hemoglobin Latest Range: 12.0-15.0 g/dL 9.0 (L) 9.2 (L) 9.7 (L) 10.2 (L) 8.5 (L) 8.6 (L) 8.4 (L) 8.8 (L) 8.3 (L)    Since this is not an acute issue, I recommend outpatient work-up for anemia.  She presented to the ED on 06/06/2014 with her stable anemia of 8.9 g/dL and sudden drop in Hgb is likely secondary to IV fluids and may represent a more accurate result.  This is likely exacerbated by acute hospital issues, namely cardiac and infectious process(es).  If she is symptomatic, I would recommend a 2 unit PRBC transfusion.    Leukopenia also noted and patient is noted to have intermittent leukopenia according to electronic medical record dating back to 2013 with a WBC of 3.4 on 07/14/2011.  WBC today is 3.4 with a normal differential.  No hematologic intervention needed at this time.  Likely reactive to hospital issue(s). This too can be further evaluated as an outpatient.   Thrombocytopenia is noted.  This also dates back to at least 2013 according to the electronic medical record.  On 06/06/2014 when she presented to the ED, her platelet count was 115 and has since dropped to 77.  This is likely dilutional +/- reactive to hospital issues.  Recommend platelet transfusion for a platelet count of 10,000 or less and/or active bleeding.  This can be evaluated as an outpatient.  Attending physician's note reports that the patient has refused screening tests in past, including colonoscopy.  Imaging studies noted to reveal cirrhosis of liver with nodular and shrunken features which will contribute to chronic anemia,  thrombocytopenia, and leukopenia.  With a lack of acute hematologic and oncologic issues appreciated on chart review, and chronic abnormalities of CBC dating back to at least 2013, outpatient referral is more appropriate than inpatient consultation.  Therefore, upon discharge, patient should be scheduled for an [facility] appointment approximately 1-2 weeks after discharge to evaluate chronic anemia.  This has been added to the patient's discharge information.  Thank you and we look forward to working with you and your patient regarding her outpatient evaluation of anemia/pancytopenia.  Please re-consult or call the [facility] [phone]) with any acute hematologic or oncologic issues related to Tracey Heath.  Patient and plan discussed with Dr. Loma MessingShannon Penland and she is in agreement with the aforementioned.    KEFALAS,THOMAS 06/09/2014 11:50 AM   Nothing additional to add. She certainly needs a thorough anemia evaluation but given chronicity of her anemia would recommend f/u as an outpatient at [facility] once her acute issues are resolved.  Will make sure she has f/u scheduled with us at discharge. Chiquita LothS Penland MD

## 2014-06-09 NOTE — Progress Notes (Signed)
Consulting cardiologist: Nona Dell MD  Cardiology Specific Problem List:  1. Aortic Stenosis 2. PAF on coumadin   Subjective:    "My breathing is tight since I went downstairs to have all those pictures made."  Objective:   Temp:  [98.1 F (36.7 C)-99.8 F (37.7 C)] 98.1 F (36.7 C) (03/10 0452) Pulse Rate:  [64-76] 64 (03/10 0452) Resp:  [20] 20 (03/10 0452) BP: (103-134)/(46-54) 134/46 mmHg (03/10 0452) SpO2:  [92 %-98 %] 96 % (03/10 0452) Last BM Date: 06/06/14  Filed Weights   06/06/14 1201  Weight: 192 lb (87.091 kg)    Intake/Output Summary (Last 24 hours) at 06/09/14 0900 Last data filed at 06/08/14 1856  Gross per 24 hour  Intake    400 ml  Output    150 ml  Net    250 ml    Exam:  General: No acute distress. Generalized weakness   Lungs: Expiratory wheezes, with mild crackles at the bases.  Cardiac: No elevated JVP or bruits. IRRR 3/6 systolic murmur with preserved S2, radiation to the carotids bilaterally and into the abdomen, no gallop or rub.   Abdomen: Normoactive bowel sounds, nontender, nondistended.  Extremities: Mild 1+ bilateral pitting edema, distal pulses full.  Neuropsychiatric: Alert and oriented x3, affect appropriate.   Echocardiogram 06/02/2014 Left ventricle: There was focal basal hypertrophy. Systolic function was vigorous. The estimated ejection fraction was in the range of 65% to 70%. Wall motion was normal; there were no regional wall motion abnormalities. Features are consistent with a pseudonormal left ventricular filling pattern, with concomitant abnormal relaxation and increased filling pressure (grade 2 diastolic dysfunction). - Aortic valve: Moderately to severely calcified annulus. Trileaflet; moderately thickened leaflets. There was moderate to severe stenosis. Peak velocity (S): 394.63 cm/s. Mean gradient (S): 37 mm Hg. Valve area (VTI): 1.02 cm^2. Valve area (Vmax): 0.91 cm^2. Valve  area (Vmean): 0.97 cm^2. - Mitral valve: Moderately to severely calcified annulus. Moderately thickened leaflets . There is a mild gradient across the mitral valve (mean gradient 4 mmHg) suggestive of mild mitral stenosis. MVA by pressure 1/2 time is normal at 2.10 cm squared - Left atrium: The atrium was severely dilated. - Right ventricle: The cavity size was mildly dilated. - Right atrium: The atrium was moderately dilated. - Pulmonary arteries: Systolic pressure was moderately increased. PA peak pressure: 48 mm Hg (S). - Pericardium, extracardiac: There is ascites noted surrounding the liver. - Technically adequate study.  Lab Results:  Basic Metabolic Panel:  Recent Labs Lab 06/06/14 1213 06/07/14 0648 06/09/14 0619  NA 138 139 139  K 4.5 4.5 4.3  CL 109 112 113*  CO2 GLUCOSE 157* 123* 146*  BUN 35* 35* 30*  CREATININE 1.57* 1.46* 1.47*  CALCIUM 8.8 8.2* 8.0*    Liver Function Tests:  Recent Labs Lab 06/06/14 1213 06/07/14 0648  AST 29 22  ALT 18 15  ALKPHOS 49 40  BILITOT 1.9* 1.5*  PROT 6.4 5.4*  ALBUMIN 2.6* 2.1*    CBC:  Recent Labs Lab 06/07/14 0648 06/08/14 0614 06/09/14 0619  WBC 3.8* 4.1 3.4*  HGB 7.6* 7.4* 7.1*  HCT 22.6* 22.0* 21.2*  MCV 96.6 97.3 96.8  PLT 85* 81* 77*    Cardiac Enzymes:  Recent Labs Lab 06/06/14 1213  TROPONINI <0.03    Coagulation:  Recent Labs Lab 06/07/14 0648 06/08/14 0614 06/09/14 0619  INR 3.14* 2.94* 2.86*    Radiology: US Abdomen Complete  06/08/2014  CLINICAL DATA:  Elevated bilirubin.  EXAM: ULTRASOUND ABDOMEN COMPLETE  COMPARISON:  CT 12/13/2011  FINDINGS: Gallbladder: Difficult to visualize. , not definitively seen. Shadowing structure is noted inferior to the liver. Is unknown if this represents gallbladder filled with gallstones or gas within the duodenum. No gallstones were seen on prior CT.  Common bile duct: Diameter: Limited visualization, 4 mm where visualized.   Liver: Nodular, shrunken appearance compatible with cirrhosis. No focal abnormality. Question mild intrahepatic biliary ductal dilatation.  IVC: Not visualized due to overlying bowel gas.  Pancreas: Not visualized due to overlying bowel gas.  Spleen: Size and appearance within normal limits.  Right Kidney: Length: 9.1 cm. Echogenicity within normal limits. No mass or hydronephrosis visualized.  Left Kidney: Length: Not visualized due to overlying bowel gas.  Abdominal aorta: Visualized proximally, non aneurysmal. Remainder obscured by bowel gas.  Other findings: There is moderate perihepatic and perisplenic ascites. Small right pleural effusion partially visualized.  IMPRESSION: Changes of cirrhosis with nodular shrunken liver and upper abdominal ascites. There appears to be mild intrahepatic biliary ductal dilatation.  Small right effusion.  Study limited by a ascites and excessive bowel gas. Gallbladder not definitively seen. There is a shadowing structure in inferior to the liver which could represent a gallbladder filled with gallstones or gas within the duodenum.   Electronically Signed   By: Charlett Nose M.D.   On: 06/08/2014 13:42      Medications:   Scheduled Medications: . brimonidine  1 drop Both Eyes BID  . dorzolamide-timolol  1 drop Both Eyes BID  . furosemide  20 mg Oral Daily  . latanoprost  1 drop Both Eyes QHS  . levofloxacin (LEVAQUIN) IV  750 mg Intravenous Q48H  . levothyroxine  50 mcg Oral QAC breakfast  . metoprolol tartrate  25 mg Oral BID  . sodium chloride  3 mL Intravenous Q12H  . Warfarin - Pharmacist Dosing Inpatient   Does not apply Q24H      PRN Medications:  sodium chloride, sodium chloride   Assessment and Plan:   1. Aortic Stenosis: Moderate to severe per echo on 06/07/2014. Conservative approach has been undertaken so far per Dr. Janna Arch, it is mentioned that Ms. Romagnoli has declined aggressive workup. It is not clear that her current presentation is related  to aortic stenosis.  2. Atrial fibrillation:  Heart rate appears controlled by vital signs. Not on telemetry. Continues on coumadin therapy per Dr. Janna Arch. INR 2.86 this am. TSH slightly elevated.   3. Anemia: Hgb 7.1 this am down from 7.4.  No hx of anemia per notes, consider holding coumadin and further workup for possible GI loss. Hemoccult stools. Anemia may also be continuing to her shortness of breath.  4. Community-acquired pneumonia: Continues on antibiotics per primary team.   Bettey Mare. Lawrence NP [facility]  06/09/2014, 9:00 AM    Attending note:  Patient seen and examined. Agree with above assessment by Ms. Lawrence NP. Echocardiogram from yesterday reveals moderate to severe aortic stenosis with mean gradient 37 mmHg and valve area around 1 cm. Conservative approach has been undertaken so far per Dr. Janna Arch, and based on discussion with patient yesterday, it does not seem that she wants to pursue this aggressively. Not entirely clear that the degree of aortic stenosis is necessarily contributing to her present hospitalization. Certainly her anemia may be contributing to her shortness of breath as well as the community acquired pneumonia. Would recommend holding Coumadin, pursue possible workup for GI blood loss  with progressive anemia. Patient has a history of paroxysmal atrial fibrillation, otherwise continue rate control strategy with Lopressor. We will sign off for now.  Jonelle SidleSamuel G. Lilac Hoff, M.D., F.A.C.C.

## 2014-06-09 NOTE — Care Management Utilization Note (Signed)
UR completed 

## 2014-06-09 NOTE — Clinical Social Work Placement (Signed)
Clinical Social Work Department CLINICAL SOCIAL WORK PLACEMENT NOTE 06/09/2014  Patient:  Tracey Heath,Tracey Heath  Account Number:  [id number] Admit date:  06/06/2014  Clinical Social Worker:  Derenda FennelKARA Kahli Fitzgerald, LCSW  Date/time:  06/09/2014 11:12 AM  Clinical Social Work is seeking post-discharge placement for this patient at the following level of care:   SKILLED NURSING   (*CSW will update this form in Epic as items are completed)   06/09/2014  Patient/family provided with [facility] Department of Clinical Social Work's list of facilities offering this level of care within the geographic area requested by the patient (or if unable, by the patient's family).  06/09/2014  Patient/family informed of their freedom to choose among providers that offer the needed level of care, that participate in Medicare, Medicaid or managed care program needed by the patient, have an available bed and are willing to accept the patient.  06/09/2014  Patient/family informed of [facility]' ownership interest in [facility], as well as of the fact that they are under no obligation to receive care at this facility.  PASARR submitted to EDS on  PASARR number received on   FL2 transmitted to all facilities in geographic area requested by pt/family on  06/09/2014 FL2 transmitted to all facilities within larger geographic area on   Patient informed that his/her managed care company has contracts with or will negotiate with  certain facilities, including the following:     Patient/family informed of bed offers received:  06/09/2014 [facility] Physician recommends and patient chooses bed at    Patient to be transferred to  on   Patient to be transferred to facility by  Patient and family notified of transfer on  Name of family member notified:    The following physician request were entered in Epic:   Additional Comments: Pt has existing pasarr.  Derenda FennelKara Christin [place],  [place] [phone]

## 2014-06-09 NOTE — Progress Notes (Signed)
Subjective: She continues to improve from a respiratory point of view. She says she's not as short of breath. She is not using oxygen now.  Objective: Vital signs in last 24 hours: Temp:  [98.1 F (36.7 C)-99.8 F (37.7 C)] 98.1 F (36.7 C) (03/10 0452) Pulse Rate:  [64-76] 64 (03/10 0452) Resp:  [20] 20 (03/10 0452) BP: (103-134)/(46-54) 134/46 mmHg (03/10 0452) SpO2:  [92 %-98 %] 96 % (03/10 0452) Weight change:  Last BM Date: 06/06/14  Intake/Output from previous day: 03/09 0701 - 03/10 0700 In: 400 [P.O.:400] Out: 150 [Urine:150]  PHYSICAL EXAM General appearance: alert, cooperative and mild distress Resp: clear to auscultation bilaterally Cardio: I can tell she is in atrial fibrillation by exam but she does have a loud systolic murmur GI: soft, non-tender; bowel sounds normal; no masses,  no organomegaly Extremities: extremities normal, atraumatic, no cyanosis or edema  Lab Results:  Results for orders placed or performed during the hospital encounter of 06/06/14 (from the past 48 hour(s))  Strep pneumoniae urinary antigen     Status: None   Collection Time: 06/08/14  3:27 AM  Result Value Ref Range   Strep Pneumo Urinary Antigen NEGATIVE NEGATIVE    Comment: PERFORMED AT [facility]        Infection due to S. pneumoniae cannot be absolutely ruled out since the antigen present may be below the detection limit of the test. Performed at [facility]   TSH     Status: Abnormal   Collection Time: 06/08/14  6:14 AM  Result Value Ref Range   TSH 4.777 (H) 0.350 - 4.500 uIU/mL  Protime-INR     Status: Abnormal   Collection Time: 06/08/14  6:14 AM  Result Value Ref Range   Prothrombin Time 30.9 (H) 11.6 - 15.2 seconds   INR 2.94 (H) 0.00 - 1.49  CBC     Status: Abnormal   Collection Time: 06/08/14  6:14 AM  Result Value Ref Range   WBC 4.1 4.0 - 10.5 K/uL   RBC 2.26 (L) 3.87 - 5.11 MIL/uL   Hemoglobin 7.4 (L) 12.0 - 15.0 g/dL   HCT 22.0 (L) 36.0  - 46.0 %   MCV 97.3 78.0 - 100.0 fL   MCH 32.7 26.0 - 34.0 pg   MCHC 33.6 30.0 - 36.0 g/dL   RDW 17.1 (H) 11.5 - 15.5 %   Platelets 81 (L) 150 - 400 K/uL    Comment: SPECIMEN CHECKED FOR CLOTS CONSISTENT WITH PREVIOUS RESULT   Reticulocytes     Status: Abnormal   Collection Time: 06/08/14  6:56 AM  Result Value Ref Range   Retic Ct Pct 1.7 0.4 - 3.1 %   RBC. 2.29 (L) 3.87 - 5.11 MIL/uL   Retic Count, Manual 38.9 19.0 - 186.0 K/uL  Protime-INR     Status: Abnormal   Collection Time: 06/09/14  6:19 AM  Result Value Ref Range   Prothrombin Time 30.2 (H) 11.6 - 15.2 seconds   INR 2.86 (H) 0.00 - 1.49  CBC with Differential/Platelet     Status: Abnormal   Collection Time: 06/09/14  6:19 AM  Result Value Ref Range   WBC 3.4 (L) 4.0 - 10.5 K/uL   RBC 2.19 (L) 3.87 - 5.11 MIL/uL   Hemoglobin 7.1 (L) 12.0 - 15.0 g/dL   HCT 21.2 (L) 36.0 - 46.0 %   MCV 96.8 78.0 - 100.0 fL   MCH 32.4 26.0 - 34.0 pg   MCHC 33.5 30.0 -  36.0 g/dL   RDW 17.0 (H) 11.5 - 15.5 %   Platelets 77 (L) 150 - 400 K/uL    Comment: SPECIMEN CHECKED FOR CLOTS CONSISTENT WITH PREVIOUS RESULT    Neutrophils Relative % 62 43 - 77 %   Neutro Abs 2.1 1.7 - 7.7 K/uL   Lymphocytes Relative 21 12 - 46 %   Lymphs Abs 0.7 0.7 - 4.0 K/uL   Monocytes Relative 13 (H) 3 - 12 %   Monocytes Absolute 0.4 0.1 - 1.0 K/uL   Eosinophils Relative 4 0 - 5 %   Eosinophils Absolute 0.1 0.0 - 0.7 K/uL   Basophils Relative 1 0 - 1 %   Basophils Absolute 0.0 0.0 - 0.1 K/uL   Smear Review PLATELETS APPEAR DECREASED   Basic metabolic panel     Status: Abnormal   Collection Time: 06/09/14  6:19 AM  Result Value Ref Range   Sodium 139 135 - 145 mmol/L   Potassium 4.3 3.5 - 5.1 mmol/L   Chloride 113 (H) 96 - 112 mmol/L   CO2 23 19 - 32 mmol/L   Glucose, Bld 146 (H) 70 - 99 mg/dL   BUN 30 (H) 6 - 23 mg/dL   Creatinine, Ser 1.47 (H) 0.50 - 1.10 mg/dL   Calcium 8.0 (L) 8.4 - 10.5 mg/dL   GFR calc non Af Amer 32 (L) >90 mL/min   GFR calc  Af Amer 37 (L) >90 mL/min    Comment: (NOTE) The eGFR has been calculated using the CKD EPI equation. This calculation has not been validated in all clinical situations. eGFR's persistently <90 mL/min signify possible Chronic Kidney Disease.    Anion gap 3 (L) 5 - 15    ABGS No results for input(s): PHART, PO2ART, TCO2, HCO3 in the last 72 hours.  Invalid input(s): PCO2 CULTURES Recent Results (from the past 240 hour(s))  Culture, blood (routine x 2) Call MD if unable to obtain prior to antibiotics being given     Status: None (Preliminary result)   Collection Time: 06/06/14  9:27 PM  Result Value Ref Range Status   Specimen Description BLOOD LEFT ANTECUBITAL  Final   Special Requests BOTTLES DRAWN AEROBIC AND ANAEROBIC 6CC EACH  Final   Culture NO GROWTH 2 DAYS  Final   Report Status PENDING  Incomplete  Culture, blood (routine x 2) Call MD if unable to obtain prior to antibiotics being given     Status: None (Preliminary result)   Collection Time: 06/06/14  9:27 PM  Result Value Ref Range Status   Specimen Description BLOOD LEFT ANTECUBITAL  Final   Special Requests BOTTLES DRAWN AEROBIC AND ANAEROBIC 4CC EACH  Final   Culture NO GROWTH 2 DAYS  Final   Report Status PENDING  Incomplete   Studies/Results: US Abdomen Complete  06/08/2014   CLINICAL DATA:  Elevated bilirubin.  EXAM: ULTRASOUND ABDOMEN COMPLETE  COMPARISON:  CT 12/13/2011  FINDINGS: Gallbladder: Difficult to visualize. , not definitively seen. Shadowing structure is noted inferior to the liver. Is unknown if this represents gallbladder filled with gallstones or gas within the duodenum. No gallstones were seen on prior CT.  Common bile duct: Diameter: Limited visualization, 4 mm where visualized.  Liver: Nodular, shrunken appearance compatible with cirrhosis. No focal abnormality. Question mild intrahepatic biliary ductal dilatation.  IVC: Not visualized due to overlying bowel gas.  Pancreas: Not visualized due to  overlying bowel gas.  Spleen: Size and appearance within normal limits.  Right Kidney: Length: 9.1  cm. Echogenicity within normal limits. No mass or hydronephrosis visualized.  Left Kidney: Length: Not visualized due to overlying bowel gas.  Abdominal aorta: Visualized proximally, non aneurysmal. Remainder obscured by bowel gas.  Other findings: There is moderate perihepatic and perisplenic ascites. Small right pleural effusion partially visualized.  IMPRESSION: Changes of cirrhosis with nodular shrunken liver and upper abdominal ascites. There appears to be mild intrahepatic biliary ductal dilatation.  Small right effusion.  Study limited by a ascites and excessive bowel gas. Gallbladder not definitively seen. There is a shadowing structure in inferior to the liver which could represent a gallbladder filled with gallstones or gas within the duodenum.   Electronically Signed   By: Rolm Baptise M.D.   On: 06/08/2014 13:42    Medications:  Prior to Admission:  Prescriptions prior to admission  Medication Sig Dispense Refill Last Dose  . albuterol-ipratropium (COMBIVENT) 18-103 MCG/ACT inhaler Inhale 2 puffs into the lungs every 4 (four) hours as needed for wheezing. 1 Inhaler 0   . alendronate (FOSAMAX) 70 MG tablet Take 70 mg by mouth once a week. Take with a full glass of water on an empty stomach.   Past Week at Unknown time  . aspirin EC 81 MG tablet Take 81 mg by mouth daily.   06/06/2014 at Unknown time  . cholecalciferol (VITAMIN D) 1000 UNITS tablet Take 2,000 Units by mouth daily.    06/06/2014 at Unknown time  . glimepiride (AMARYL) 2 MG tablet Take 2 mg by mouth daily.    06/06/2014 at Unknown time  . ketorolac (ACULAR) 0.4 % SOLN Apply 1 drop to eye 4 (four) times daily. PRIOR TO SURGERY   06/06/2014 at Unknown time  . levothyroxine (SYNTHROID, LEVOTHROID) 50 MCG tablet Take 50 mcg by mouth daily before breakfast.   06/06/2014 at Unknown time  . metFORMIN (GLUCOPHAGE) 500 MG tablet Take 500 mg by mouth  2 (two) times daily with a meal.   06/06/2014 at Unknown time  . metoprolol tartrate (LOPRESSOR) 25 MG tablet Take 25 mg by mouth 2 (two) times daily.   06/06/2014 at Unknown time  . Multiple Vitamins-Minerals (ICAPS) TABS Take 1 tablet by mouth daily.   06/06/2014 at Unknown time  . ofloxacin (OCUFLOX) 0.3 % ophthalmic solution Apply 1 drop to eye 4 (four) times daily. PRIOR TO SURGERY   06/06/2014 at Unknown time  . Omega-3 Fatty Acids (FISH OIL) 1200 MG CAPS Take 1 capsule by mouth daily.   06/06/2014 at Unknown time  . OMEGA-3 KRILL OIL PO Take 1 capsule by mouth daily.   06/06/2014 at Unknown time  . prednisoLONE acetate (PRED FORTE) 1 % ophthalmic suspension Apply 1 drop to eye 4 (four) times daily. AFTER SURGERY   06/06/2014 at Unknown time  . warfarin (COUMADIN) 2.5 MG tablet Take by mouth every evening.    06/05/2014 at Unknown time   Scheduled: . brimonidine  1 drop Both Eyes BID  . dorzolamide-timolol  1 drop Both Eyes BID  . furosemide  20 mg Oral Daily  . latanoprost  1 drop Both Eyes QHS  . levofloxacin (LEVAQUIN) IV  750 mg Intravenous Q48H  . levothyroxine  50 mcg Oral QAC breakfast  . metoprolol tartrate  25 mg Oral BID  . sodium chloride  3 mL Intravenous Q12H  . Warfarin - Pharmacist Dosing Inpatient   Does not apply Q24H   Continuous:  JOA:CZYSAY chloride, sodium chloride  Assesment: She was admitted with community-acquired pneumonia and has improved. She seems  to be having more cardiac issues now with aortic stenosis mitral stenosis and what appears to be some heart failure. She also has pancytopenia. Principal Problem:   CAP (community acquired pneumonia) Active Problems:   Atrial fibrillation   Chronic anticoagulation   Anemia   Frequent falls   Generalized weakness   Renal insufficiency, mild   Thrombocytopenia   Undiagnosed cardiac murmurs   Elevated bilirubin    Plan: Continue treatment for pneumonia. Continue other workup etc. per Dr. Dr. Bea Graff    LOS: 2 days    Dahir Ayer L 06/09/2014, 8:58 AM

## 2014-06-09 NOTE — Clinical Social Work Note (Signed)
CSW presented bed offers to pt and pt's son and [facility] notified and will start authorization. CSW will continue to follow.  Derenda FennelKara Nishawn Rotan, [place] [phone]

## 2014-06-09 NOTE — Progress Notes (Signed)
PHARMACIST - PHYSICIAN COMMUNICATION DR:   Juanetta GoslingHawkins / DonDiego CONCERNING: Antibiotic IV to Oral Route Change Policy  RECOMMENDATION: This patient is receiving Levaquin by the intravenous route.  Based on criteria approved by the Pharmacy and Therapeutics Committee, the antibiotic(s) is/are being converted to the equivalent oral dose form(s).  DESCRIPTION: These criteria include:  Patient being treated for a respiratory tract infection, urinary tract infection, cellulitis or clostridium difficile associated diarrhea if on metronidazole  The patient is not neutropenic and does not exhibit a GI malabsorption state  The patient is eating (either orally or via tube) and/or has been taking other orally administered medications for a least 24 hours  The patient is improving clinically and has a Tmax < 100.5  If you have questions about this conversion, please contact the Pharmacy Department  [x]   [phone]( [phone] )  [facility] []   [phone]  [facility]  []   [phone]( [phone] )  [facility] []   [phone]( [phone] )  [facility]   Valrie HartScott Ky Rumple, PharmD

## 2014-06-09 NOTE — Clinical Social Work Psychosocial (Signed)
Clinical Social Work Department BRIEF PSYCHOSOCIAL ASSESSMENT 06/09/2014  Patient:  Tracey Heath, Tracey Heath     Account Number:  [id number]     Admit date:  06/06/2014  Clinical Social Worker:  Wyatt Haste  Date/Time:  06/09/2014 11:18 AM  Referred by:  CSW  Date Referred:  06/09/2014 Referred for  SNF Placement   Other Referral:   Interview type:  Patient Other interview type:   son- Tracey Heath    PSYCHOSOCIAL DATA Living Status:  ALONE Admitted from facility:   Level of care:   Primary support name:  Tracey Heath Primary support relationship to patient:  CHILD, ADULT Degree of support available:   supportive    CURRENT CONCERNS Current Concerns  Post-Acute Placement   Other Concerns:    SOCIAL WORK ASSESSMENT / PLAN CSW met with pt and pt's son, Tracey Heath at bedside. Pt alert and oriented and reports she lives alone. Tracey Heath lives across the street from her and checks on pt throughout day. He also provides meals and transportation. Pt has two sons, but Tracey Heath is most involved. He reports that pt became very weak and short of breath at home and went to see Dr. Cindie Laroche who sent pt to hospital. Admitted with community acquired pneumonia. PT worked with pt today and strongly recommends SNF. Pt admits to feeling very weak and was only able to ambulate a few feet today. CSW discussed placement process, including [employer] authorization. Pt and son aware and agreeable. Pt's husband went to SNF in the past so they have had some experience at rehab. When asked how she felt about SNF, pt responded, "If I have to, I have to." They request [facility] if possible. CSW will initiate bed search.   Assessment/plan status:  Psychosocial Support/Ongoing Assessment of Needs Other assessment/ plan:   Information/referral to community resources:   SNF list    PATIENT'S/FAMILY'S RESPONSE TO PLAN OF CARE: Pt appears resigned to recommendation for SNF. Son feels this is best option at d/c.       Benay Pike, [place]

## 2014-06-09 NOTE — Progress Notes (Signed)
Complex multisystemic issues consisting of presumed anemia of chronic disease hemoglobin 7.4 moderate to severe aortic stenosis. Concomitant mild mitral stenosis currently in sinus rhythm history of paroxysmal atrial fibrillation generalized debility and pneumonia Coumadin has been discontinued based on Levaquin and Lovenox seeming to improve. Reticulocyte count is not elevated suspect bone marrow suppression she has refused colonoscopy and all the procedures over 3-4 years. And we have respected that Tracey Heath NWG:[id number] DOB: 1929/12/19 DOA: 06/06/2014 PCP: Tracey Stalling, MD             Physical Exam: Blood pressure 134/46, pulse 64, temperature 98.1 F (36.7 C), temperature source Oral, resp. rate 20, height  (1.727 m), weight 192 lb (87.091 kg), SpO2 96 %. lungs no detectable rales today no wheeze mildly prolonged expiratory phase no JVD no carotid bruits no thyromegaly heart regular rhythm 3/6 systolic ejection murmur at the aortic focus no S3 auscultated x-rays 1+ chronic pedal edema   Investigations:  Recent Results (from the past 240 hour(s))  Culture, blood (routine x 2) Call MD if unable to obtain prior to antibiotics being given     Status: None (Preliminary result)   Collection Time: 06/06/14  9:27 PM  Result Value Ref Range Status   Specimen Description BLOOD LEFT ANTECUBITAL  Final   Special Requests BOTTLES DRAWN AEROBIC AND ANAEROBIC [facility] EACH  Final   Culture NO GROWTH 2 DAYS  Final   Report Status PENDING  Incomplete  Culture, blood (routine x 2) Call MD if unable to obtain prior to antibiotics being given     Status: None (Preliminary result)   Collection Time: 06/06/14  9:27 PM  Result Value Ref Range Status   Specimen Description BLOOD LEFT ANTECUBITAL  Final   Special Requests BOTTLES DRAWN AEROBIC AND ANAEROBIC 4CC EACH  Final   Culture NO GROWTH 2 DAYS  Final   Report Status PENDING  Incomplete     Basic Metabolic Panel:  Recent Labs   06/06/14 1213 06/07/14 0648  NA 138 139  K 4.5 4.5  CL 109 112  CO2 20 21  GLUCOSE 157* 123*  BUN 35* 35*  CREATININE 1.57* 1.46*  CALCIUM 8.8 8.2*   Liver Function Tests:  Recent Labs  06/06/14 1213 06/07/14 0648  AST 29 22  ALT 18 15  ALKPHOS 49 40  BILITOT 1.9* 1.5*  PROT 6.4 5.4*  ALBUMIN 2.6* 2.1*     CBC:  Recent Labs  06/07/14 0648 06/08/14 0614 06/09/14 0619  WBC 3.8* 4.1 3.4*  NEUTROABS 2.5  --  2.1  HGB 7.6* 7.4* 7.1*  HCT 22.6* 22.0* 21.2*  MCV 96.6 97.3 96.8  PLT 85* 81* 77*    US Abdomen Complete  06/08/2014   CLINICAL DATA:  Elevated bilirubin.  EXAM: ULTRASOUND ABDOMEN COMPLETE  COMPARISON:  CT 12/13/2011  FINDINGS: Gallbladder: Difficult to visualize. , not definitively seen. Shadowing structure is noted inferior to the liver. Is unknown if this represents gallbladder filled with gallstones or gas within the duodenum. No gallstones were seen on prior CT.  Common bile duct: Diameter: Limited visualization, 4 mm where visualized.  Liver: Nodular, shrunken appearance compatible with cirrhosis. No focal abnormality. Question mild intrahepatic biliary ductal dilatation.  IVC: Not visualized due to overlying bowel gas.  Pancreas: Not visualized due to overlying bowel gas.  Spleen: Size and appearance within normal limits.  Right Kidney: Length: 9.1 cm. Echogenicity within normal limits. No mass or hydronephrosis visualized.  Left Kidney: Length: Not visualized due  to overlying bowel gas.  Abdominal aorta: Visualized proximally, non aneurysmal. Remainder obscured by bowel gas.  Other findings: There is moderate perihepatic and perisplenic ascites. Small right pleural effusion partially visualized.  IMPRESSION: Changes of cirrhosis with nodular shrunken liver and upper abdominal ascites. There appears to be mild intrahepatic biliary ductal dilatation.  Small right effusion.  Study limited by a ascites and excessive bowel gas. Gallbladder not definitively seen. There  is a shadowing structure in inferior to the liver which could represent a gallbladder filled with gallstones or gas within the duodenum.   Electronically Signed   By: Charlett NoseKevin  Dover M.D.   On: 06/08/2014 13:42      Medications:   Impression: Anemia of chronic disease  Principal Problem:   CAP (community acquired pneumonia) Active Problems:   Atrial fibrillation   Chronic anticoagulation   Anemia   Frequent falls   Generalized weakness   Renal insufficiency, mild   Thrombocytopenia   Undiagnosed cardiac murmurs   Elevated bilirubin     Plan: We'll obtain erythropoietin level. Consider slow transfusion considering valvular abnormalities to increase O2 carrying capacity temporally for rehabilitation and physical therapy.   Consultants: Cardiology and pulmonology   Procedures   Antibiotics: IV Levaquin                  Code Status: No code no intubation  Family Communication:    Disposition Plan obtain erythropoietin level to assess bone marrow activity consider transfusion very slowly and increase O2 carrying capacity for physical rehabilitation  Time spent: 30 minutes   LOS: 2 days   Tracey Heath M   06/09/2014, 6:57 AM
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
## 2014-06-10 ENCOUNTER — Inpatient Hospital Stay
Admission: RE | Admit: 2014-06-10 | Discharge: 2014-06-19 | Disposition: A | Discharge status: Other Acute Inpatient Hospital | Payer: Medicare HMO | Source: Ambulatory Visit | Attending: Internal Medicine | Admitting: Internal Medicine

## 2014-06-10 DIAGNOSIS — R609 Edema, unspecified: Secondary | ICD-10-CM

## 2014-06-10 DIAGNOSIS — K5649 Other impaction of intestine: Principal | ICD-10-CM

## 2014-06-10 LAB — BASIC METABOLIC PANEL
Anion gap: 4 — ABNORMAL LOW (ref 5–15)
BUN: 27 mg/dL — AB (ref 6–23)
CHLORIDE: 111 mmol/L (ref 96–112)
CO2: 23 mmol/L (ref 19–32)
CREATININE: 1.33 mg/dL — AB (ref 0.50–1.10)
Calcium: 7.9 mg/dL — ABNORMAL LOW (ref 8.4–10.5)
GFR, EST AFRICAN AMERICAN: 42 mL/min — AB (ref >90)
GFR, EST NON AFRICAN AMERICAN: 36 mL/min — AB (ref >90)
GLUCOSE: 129 mg/dL — AB (ref 70–99)
Potassium: 4 mmol/L (ref 3.5–5.1)
Sodium: 138 mmol/L (ref 135–145)

## 2014-06-10 LAB — CBC
HEMATOCRIT: 26 % — AB (ref 36.0–46.0)
Hemoglobin: 8.9 g/dL — ABNORMAL LOW (ref 12.0–15.0)
MCH: 32.1 pg (ref 26.0–34.0)
MCHC: 34.2 g/dL (ref 30.0–36.0)
MCV: 93.9 fL (ref 78.0–100.0)
Platelets: 79 10*3/uL — ABNORMAL LOW (ref 150–400)
RBC: 2.77 MIL/uL — AB (ref 3.87–5.11)
RDW: 18.8 % — ABNORMAL HIGH (ref 11.5–15.5)
WBC: 3.9 10*3/uL — AB (ref 4.0–10.5)

## 2014-06-10 LAB — OCCULT BLOOD X 1 CARD TO LAB, STOOL: FECAL OCCULT BLD: POSITIVE — AB

## 2014-06-10 LAB — PREPARE RBC (CROSSMATCH)

## 2014-06-10 LAB — ERYTHROPOIETIN: ERYTHROPOIETIN: 15.6 m[IU]/mL (ref 2.6–18.5)

## 2014-06-10 LAB — PROTIME-INR
INR: 2.61 — ABNORMAL HIGH (ref 0.00–1.49)
Prothrombin Time: 28.1 seconds — ABNORMAL HIGH (ref 11.6–15.2)

## 2014-06-10 MED ORDER — BISACODYL 5 MG PO TBEC: 5.0000 mg | DELAYED_RELEASE_TABLET | Freq: Two times a day (BID) | ORAL | Status: AC | PRN

## 2014-06-10 MED ORDER — LATANOPROST 0.005 % OP SOLN: 1.0000 [drp] | Freq: Every day | OPHTHALMIC | Status: AC

## 2014-06-10 MED ORDER — BRIMONIDINE TARTRATE 0.15 % OP SOLN: 1.0000 [drp] | Freq: Two times a day (BID) | OPHTHALMIC | Status: AC

## 2014-06-10 MED ORDER — SODIUM CHLORIDE 0.9 % IV SOLN
Freq: Once | INTRAVENOUS | Status: AC
  Administered 2014-06-10: 12:00:00 via INTRAVENOUS

## 2014-06-10 MED ORDER — FUROSEMIDE 20 MG PO TABS: 20.0000 mg | ORAL_TABLET | Freq: Every day | ORAL | Status: AC

## 2014-06-10 MED ORDER — DORZOLAMIDE HCL-TIMOLOL MAL 2-0.5 % OP SOLN: 1.0000 [drp] | Freq: Two times a day (BID) | OPHTHALMIC | Status: AC

## 2014-06-10 NOTE — Progress Notes (Signed)
Patient was seen by hematology. She is one unit of PRBCs yesterday with 30 slowly she is not symptomatic from congestive heart failure perspective transfuse an additional second unit today over 3 hours slowly for increased O2 carrying capacity for rehabilitation pneumonia is clinically improving with Levaquin and nebulizer therapy she was hemodynamically stable with chronic aortic stenosis with concomitant mitral stenosis early in sinus rhythm Tracey Heath ZOX:[id number] DOB: 1929-10-01 DOA: 06/06/2014 PCP: Tracey Stalling, MD             Physical Exam: Blood pressure 112/45, pulse 63, temperature 97.8 F (36.6 C), temperature source Oral, resp. rate 18, height  (1.727 Heath), weight 192 lb (87.091 kg), SpO2 98 %. neck no JVD no carotid bruits no thyromegaly lungs no rales wheeze or rhonchi appreciable diminished breath sounds in the bases heart regular rhythm 3/6 systolic ejection murmur at the aortic focus no rubs appreciable no heaves no thrills abdomen soft nontender bowel sounds normoactive extremities 1+ chronic pedal edema   Investigations:  Recent Results (from the past 240 hour(s))  Culture, blood (routine x 2) Call MD if unable to obtain prior to antibiotics being given     Status: None (Preliminary result)   Collection Time: 06/06/14  9:27 PM  Result Value Ref Range Status   Specimen Description BLOOD LEFT ANTECUBITAL  Final   Special Requests BOTTLES DRAWN AEROBIC AND ANAEROBIC [facility] EACH  Final   Culture NO GROWTH 3 DAYS  Final   Report Status PENDING  Incomplete  Culture, blood (routine x 2) Call MD if unable to obtain prior to antibiotics being given     Status: None (Preliminary result)   Collection Time: 06/06/14  9:27 PM  Result Value Ref Range Status   Specimen Description BLOOD LEFT ANTECUBITAL  Final   Special Requests BOTTLES DRAWN AEROBIC AND ANAEROBIC 4CC EACH  Final   Culture NO GROWTH 3 DAYS  Final   Report Status PENDING  Incomplete     Basic  Metabolic Panel:  Recent Labs  81/19/14 0619  NA 139  K 4.3  CL 113*  CO2 23  GLUCOSE 146*  BUN 30*  CREATININE 1.47*  CALCIUM 8.0*   Liver Function Tests: No results for input(s): AST, ALT, ALKPHOS, BILITOT, PROT, ALBUMIN in the last 72 hours.   CBC:  Recent Labs  06/08/14 0614 06/09/14 0619  WBC 4.1 3.4*  NEUTROABS  --  2.1  HGB 7.4* 7.1*  HCT 22.0* 21.2*  MCV 97.3 96.8  PLT 81* 77*    US Abdomen Complete  06/08/2014   CLINICAL DATA:  Elevated bilirubin.  EXAM: ULTRASOUND ABDOMEN COMPLETE  COMPARISON:  CT 12/13/2011  FINDINGS: Gallbladder: Difficult to visualize. , not definitively seen. Shadowing structure is noted inferior to the liver. Is unknown if this represents gallbladder filled with gallstones or gas within the duodenum. No gallstones were seen on prior CT.  Common bile duct: Diameter: Limited visualization, 4 mm where visualized.  Liver: Nodular, shrunken appearance compatible with cirrhosis. No focal abnormality. Question mild intrahepatic biliary ductal dilatation.  IVC: Not visualized due to overlying bowel gas.  Pancreas: Not visualized due to overlying bowel gas.  Spleen: Size and appearance within normal limits.  Right Kidney: Length: 9.1 cm. Echogenicity within normal limits. No mass or hydronephrosis visualized.  Left Kidney: Length: Not visualized due to overlying bowel gas.  Abdominal aorta: Visualized proximally, non aneurysmal. Remainder obscured by bowel gas.  Other findings: There is moderate perihepatic and perisplenic ascites. Small right pleural  effusion partially visualized.  IMPRESSION: Changes of cirrhosis with nodular shrunken liver and upper abdominal ascites. There appears to be mild intrahepatic biliary ductal dilatation.  Small right effusion.  Study limited by a ascites and excessive bowel gas. Gallbladder not definitively seen. There is a shadowing structure in inferior to the liver which could represent a gallbladder filled with gallstones or  gas within the duodenum.   Electronically Signed   By: Charlett NoseKevin  Heath Heath.D.   On: 06/08/2014 13:42      Medications:   Impression:  Principal Problem:   CAP (community acquired pneumonia) Active Problems:   Atrial fibrillation   Chronic anticoagulation   Anemia   Frequent falls   Generalized weakness   Renal insufficiency, mild   Thrombocytopenia   Undiagnosed cardiac murmurs   Elevated bilirubin     Plan: Discuss skilled nursing facility rehabilitation with patient and she was open to this though not totally decided to transfuse second unit PRBCs today to increase O2 carrying capacity and the Levaquin and nebulizer therapy aggressive physical therapy for strengthening and ambulation and Lovenox in a.Heath.  Consultants: Cardiology hematology and pulmonology   Procedures   Antibiotics: Levaquin                  Code Status: No code  Family Communication:    Disposition Plan and she second unit PRBCs Tracey Heath says strengthening ambulation with physical therapy patient considering SNF placement for several weeks for strengthening continue nebulizer therapy out of bed to chair 3 times a day  Time spent: 30 minutes   LOS: 3 days   Tracey Heath   06/10/2014, 7:05 AM

## 2014-06-10 NOTE — Discharge Summary (Signed)
Physician Discharge Summary  Tracey CokeFrances M Heldman ZOX:[id number] DOB: 11/02/1929 DOA: 06/06/2014  PCP: Isabella StallingNDIEGO,Roni Scow M, MD  Admit date: 06/06/2014 Discharge date: 06/10/2014   Recommendations for Outpatient Follow-up: Patient is discharged to skilled nursing facility to work on strengthening ambulation steadiness of gait is been in progressive decline for 6 weeks Discharge Diagnoses:  Principal Problem:   CAP (community acquired pneumonia) Active Problems:   Atrial fibrillation   Chronic anticoagulation   Anemia   Frequent falls   Generalized weakness   Renal insufficiency, mild   Thrombocytopenia   Undiagnosed cardiac murmurs   Elevated bilirubin   Discharge Condition: Stable  Filed Weights   06/06/14 1201  Weight: 192 lb (87.091 kg)    History of present illness:  Patient was admitted with a 6 week decline in generalized weakness found to have a left middle lobe infiltrate on chest x-ray treated with Levaquin for 6 days in the hospital and nebulizer therapy disc cleared up rather well was likewise found to have anemia of chronic disease iron studies were normal as were B-12 folic acid 2-D echocardiogram 3 confirmed her moderate to severe aortic stenosis and mild to moderate mitral stenosis which is concomitant she's currently in sinus rhythm she was on anticoagulation for history of paroxysmal A. fib however this was discontinued she is to units of packed cells 2 days prior to discharge with no evidence of acute congestive heart failure or volume overload antibiotic for discontinued however nebulizer therapy was to be continued she is discharged for aggressive physical therapy strengthening and ambulation for several weeks' time hopefully to return home  Hospital Course:  See history of present illness  Procedures:    Consultations:  Hematology pulmonology cardiology  Discharge Instructions  Discharge Instructions    Discharge instructions    Complete by:  As directed       Discharge patient    Complete by:  As directed             Medication List    STOP taking these medications        glimepiride 2 MG tablet  Commonly known as:  AMARYL     warfarin 2.5 MG tablet  Commonly known as:  COUMADIN      TAKE these medications        albuterol-ipratropium 18-103 MCG/ACT inhaler  Commonly known as:  COMBIVENT  Inhale 2 puffs into the lungs every 4 (four) hours as needed for wheezing.     alendronate 70 MG tablet  Commonly known as:  FOSAMAX  Take 70 mg by mouth once a week. Take with a full glass of water on an empty stomach.     aspirin EC 81 MG tablet  Take 81 mg by mouth daily.     bisacodyl 5 MG EC tablet  Commonly known as:  DULCOLAX  Take 1 tablet (5 mg total) by mouth 2 (two) times daily as needed for mild constipation or moderate constipation.     brimonidine 0.15 % ophthalmic solution  Commonly known as:  ALPHAGAN  Place 1 drop into both eyes 2 (two) times daily.     cholecalciferol 1000 UNITS tablet  Commonly known as:  VITAMIN D  Take 2,000 Units by mouth daily.     dorzolamide-timolol 22.3-6.8 MG/ML ophthalmic solution  Commonly known as:  COSOPT  Place 1 drop into both eyes 2 (two) times daily.     Fish Oil 1200 MG Caps  Take 1 capsule by mouth daily.  furosemide 20 MG tablet  Commonly known as:  LASIX  Take 1 tablet (20 mg total) by mouth daily.     ICAPS Tabs  Take 1 tablet by mouth daily.     ketorolac 0.4 % Soln  Commonly known as:  ACULAR  Apply 1 drop to eye 4 (four) times daily. PRIOR TO SURGERY     latanoprost 0.005 % ophthalmic solution  Commonly known as:  XALATAN  Place 1 drop into both eyes at bedtime.     levothyroxine 50 MCG tablet  Commonly known as:  SYNTHROID, LEVOTHROID  Take 50 mcg by mouth daily before breakfast.     metFORMIN 500 MG tablet  Commonly known as:  GLUCOPHAGE  Take 500 mg by mouth 2 (two) times daily with a meal.     metoprolol tartrate 25 MG tablet  Commonly known  as:  LOPRESSOR  Take 25 mg by mouth 2 (two) times daily.     ofloxacin 0.3 % ophthalmic solution  Commonly known as:  OCUFLOX  Apply 1 drop to eye 4 (four) times daily. PRIOR TO SURGERY     OMEGA-3 KRILL OIL PO  Take 1 capsule by mouth daily.     prednisoLONE acetate 1 % ophthalmic suspension  Commonly known as:  PRED FORTE  Apply 1 drop to eye 4 (four) times daily. AFTER SURGERY       Allergies  Allergen Reactions  . Codeine Hives and Shortness Of Breath  . Aleve [Naproxen Sodium]     Swelling in feet  . Hydrocodone   . Oxycodone   . Shellfish Allergy Swelling  . Tetanus Toxoids     Allergic to shellfish   . Penicillins Rash  . Sulfa Antibiotics Rash       Follow-up Information    Follow up with [facility].   Why:  Outpatient consult in 1-2 weeks following discharge from hospital for evaluation of "chronic anemia."   Contact information:   [address] [place] [address]        The results of significant diagnostics from this hospitalization (including imaging, microbiology, ancillary and laboratory) are listed below for reference.    Significant Diagnostic Studies: Dg Chest 1 View  06/06/2014   CLINICAL DATA:  Status post fall 3 times over the weekend  EXAM: CHEST  1 VIEW  COMPARISON:  December 16, 2011  FINDINGS: The mediastinal contour is normal. The heart size is probably enlarged. There is patchy consolidation of medial left lung base. There is no pulmonary edema or pleural effusion. There is chronic deformity of the lateral right fourth rib. There is deformity of the lateral right fifth rib, acute fracture is not excluded if the patient has focal pain here. There is no pneumothorax.  IMPRESSION: Patchy consolidation of medial left lung base suspicious for pneumonia.  Deformity of the lateral right fifth rib, acute fracture is not excluded if the patient has focal pain here.   Electronically Signed   By: Sherian Rein M.D.    On: 06/06/2014 17:20   Ct Head Wo Contrast  06/06/2014   CLINICAL DATA:  81 year old female with weakness  EXAM: CT HEAD WITHOUT CONTRAST  TECHNIQUE: Contiguous axial images were obtained from the base of the skull through the vertex without intravenous contrast.  COMPARISON:  Prior CT scan of the head and cervical spine 12/13/2011  FINDINGS: Negative for acute intracranial hemorrhage, acute infarction, mass, mass effect, hydrocephalus or midline shift. Gray-white differentiation is preserved throughout.  Relatively mild cortical atrophy for age. Mild periventricular and and subcortical white matter hypoattenuation consistent with chronic microvascular ischemic white matter disease is also similar in unchanged compared to prior. No focal soft tissue or calvarial abnormality. Bilateral globes and orbits are symmetric bilaterally. Normal aeration of the mastoid air cells and visualized paranasal sinuses. Atherosclerotic calcification in both cavernous carotid arteries. Hyperostosis frontalis interna noted incidentally. Stable small lucency in the right parieto-occipital calvarium dating back to 2013 and therefore likely benign.  IMPRESSION: 1. No acute intracranial abnormality. 2. Stable mild atrophy and chronic microvascular ischemic white matter disease.   Electronically Signed   By: Malachy Moan M.D.   On: 06/06/2014 16:54   US Abdomen Complete  06/08/2014   CLINICAL DATA:  Elevated bilirubin.  EXAM: ULTRASOUND ABDOMEN COMPLETE  COMPARISON:  CT 12/13/2011  FINDINGS: Gallbladder: Difficult to visualize. , not definitively seen. Shadowing structure is noted inferior to the liver. Is unknown if this represents gallbladder filled with gallstones or gas within the duodenum. No gallstones were seen on prior CT.  Common bile duct: Diameter: Limited visualization, 4 mm where visualized.  Liver: Nodular, shrunken appearance compatible with cirrhosis. No focal abnormality. Question mild intrahepatic biliary ductal  dilatation.  IVC: Not visualized due to overlying bowel gas.  Pancreas: Not visualized due to overlying bowel gas.  Spleen: Size and appearance within normal limits.  Right Kidney: Length: 9.1 cm. Echogenicity within normal limits. No mass or hydronephrosis visualized.  Left Kidney: Length: Not visualized due to overlying bowel gas.  Abdominal aorta: Visualized proximally, non aneurysmal. Remainder obscured by bowel gas.  Other findings: There is moderate perihepatic and perisplenic ascites. Small right pleural effusion partially visualized.  IMPRESSION: Changes of cirrhosis with nodular shrunken liver and upper abdominal ascites. There appears to be mild intrahepatic biliary ductal dilatation.  Small right effusion.  Study limited by a ascites and excessive bowel gas. Gallbladder not definitively seen. There is a shadowing structure in inferior to the liver which could represent a gallbladder filled with gallstones or gas within the duodenum.   Electronically Signed   By: Charlett Nose M.D.   On: 06/08/2014 13:42   Dg Hips Bilat With Pelvis 3-4 Views  06/06/2014   CLINICAL DATA:  Multiple falls.  Bilateral hip tenderness.  EXAM: BILATERAL HIP (WITH PELVIS) 3-4 VIEWS  COMPARISON:  None.  FINDINGS: Mild symmetric degenerative changes in the hips bilaterally. No acute bony abnormality. Specifically, no fracture, subluxation, or dislocation. Soft tissues are intact.  IMPRESSION: No acute bony abnormality.   Electronically Signed   By: Charlett Nose M.D.   On: 06/06/2014 16:59    Microbiology: Recent Results (from the past 240 hour(s))  Culture, blood (routine x 2) Call MD if unable to obtain prior to antibiotics being given     Status: None (Preliminary result)   Collection Time: 06/06/14  9:27 PM  Result Value Ref Range Status   Specimen Description BLOOD LEFT ANTECUBITAL  Final   Special Requests BOTTLES DRAWN AEROBIC AND ANAEROBIC 6CC EACH  Final   Culture NO GROWTH 3 DAYS  Final   Report Status PENDING   Incomplete  Culture, blood (routine x 2) Call MD if unable to obtain prior to antibiotics being given     Status: None (Preliminary result)   Collection Time: 06/06/14  9:27 PM  Result Value Ref Range Status   Specimen Description BLOOD LEFT ANTECUBITAL  Final   Special Requests BOTTLES DRAWN AEROBIC AND ANAEROBIC [facility] EACH  Final  Culture NO GROWTH 3 DAYS  Final   Report Status PENDING  Incomplete     Labs: Basic Metabolic Panel:  Recent Labs Lab 06/06/14 1213 06/07/14 0648 06/09/14 0619 06/10/14 0601  NA 138 139 139 138  K 4.5 4.5 4.3 4.0  CL 109 112 113* 111  CO2 GLUCOSE 157* 123* 146* 129*  BUN 35* 35* 30* 27*  CREATININE 1.57* 1.46* 1.47* 1.33*  CALCIUM 8.8 8.2* 8.0* 7.9*   Liver Function Tests:  Recent Labs Lab 06/06/14 1213 06/07/14 0648  AST 29 22  ALT 18 15  ALKPHOS 49 40  BILITOT 1.9* 1.5*  PROT 6.4 5.4*  ALBUMIN 2.6* 2.1*    Recent Labs Lab 06/06/14 1213  LIPASE 25   No results for input(s): AMMONIA in the last 168 hours. CBC:  Recent Labs Lab 06/06/14 1213 06/07/14 0648 06/08/14 0614 06/09/14 0619 06/10/14 0601  WBC 5.8 3.8* 4.1 3.4* 3.9*  NEUTROABS  --  2.5  --  2.1  --   HGB 8.9* 7.6* 7.4* 7.1* 8.9*  HCT 27.4* 22.6* 22.0* 21.2* 26.0*  MCV 97.9 96.6 97.3 96.8 93.9  PLT 115* 85* 81* 77* 79*   Cardiac Enzymes:  Recent Labs Lab 06/06/14 1213  TROPONINI <0.03   BNP: BNP (last 3 results)  Recent Labs  06/06/14 2127  BNP 623.0*    ProBNP (last 3 results) No results for input(s): PROBNP in the last 8760 hours.  CBG: No results for input(s): GLUCAP in the last 168 hours.     Signed:  Darvin Dials Judie Petit  [facility] Pager: [phone] 06/10/2014, 10:14 AM

## 2014-06-10 NOTE — Progress Notes (Signed)
Occult positive.  MD made aware.

## 2014-06-10 NOTE — Clinical Social Work Note (Signed)
[facility] Pt, pt's son Aneta Minshillip, and facility aware and agreeable. [facility]. Pt to transfer with RN. D/C summary faxed.  Derenda FennelKara Kalese Ensz, [place] [phone]

## 2014-06-10 NOTE — Progress Notes (Signed)
She is generally better. She received blood and is going to receive another unit of blood today. She is apparently being discharged to skilled care facility after her blood transfusion. She will need to have follow-up chest x-ray to document that the pneumonia clears and that could be done in somewhere between a month in 6 weeks. I will of course sign off at this point. Thank you very much for allowing me to see her with you

## 2014-06-10 NOTE — Clinical Social Work Placement (Signed)
Clinical Social Work Department CLINICAL SOCIAL WORK PLACEMENT NOTE 06/10/2014  Patient:  Tracey Heath,Tracey Heath  Account Number:  [id number] Admit date:  06/06/2014  Clinical Social Worker:  Derenda FennelKARA Sonoma Firkus, LCSW  Date/time:  06/09/2014 11:12 AM  Clinical Social Work is seeking post-discharge placement for this patient at the following level of care:   SKILLED NURSING   (*CSW will update this form in Epic as items are completed)   06/09/2014  Patient/family provided with [facility] of Clinical Social Work's list of facilities offering this level of care within the geographic area requested by the patient (or if unable, by the patient's family).  06/09/2014  Patient/family informed of their freedom to choose among providers that offer the needed level of care, that participate in Medicare, Medicaid or managed care program needed by the patient, have an available bed and are willing to accept the patient.  06/09/2014  Patient/family informed of [facility]' ownership interest in [facility], as well as of the fact that they are under no obligation to receive care at this facility.  PASARR submitted to EDS on  PASARR number received on   FL2 transmitted to all facilities in geographic area requested by pt/family on  06/09/2014 FL2 transmitted to all facilities within larger geographic area on   Patient informed that his/her managed care company has contracts with or will negotiate with  certain facilities, including the following:     Patient/family informed of bed offers received:  06/09/2014 [facility] Physician recommends and patient chooses bed at    Patient to be [facility] on  06/10/2014 Patient to be transferred to facility by RN Patient and family notified of transfer on 06/10/2014 Name of family member notified:  Aneta Minshillip- son  The following physician request were entered in Epic:   Additional  Comments: Pt has existing pasarr.  Derenda FennelKara Sybrina Laning, [place] [phone]

## 2014-06-10 NOTE — Progress Notes (Signed)
Report called to Kathie RhodesBetty at [facility] who verbalized understanding.

## 2014-06-10 NOTE — Progress Notes (Signed)
Patient in stable condition at discharge.  [facility] via bed by nurse tech.  Patient's granddaughter present at bedside at time of transfer.

## 2014-06-10 NOTE — Care Management Utilization Note (Signed)
UR completed 

## 2014-06-11 LAB — TYPE AND SCREEN
ABO/RH(D): A POS
Antibody Screen: NEGATIVE
UNIT DIVISION: 0
Unit division: 0
Unit division: 0

## 2014-06-11 LAB — CULTURE, BLOOD (ROUTINE X 2)
CULTURE: NO GROWTH
Culture: NO GROWTH

## 2014-06-13 ENCOUNTER — Ambulatory Visit (HOSPITAL_COMMUNITY): Payer: Medicare HMO | Attending: Internal Medicine

## 2014-06-13 ENCOUNTER — Non-Acute Institutional Stay (SKILLED_NURSING_FACILITY): Payer: Medicare HMO | Admitting: Internal Medicine

## 2014-06-13 ENCOUNTER — Encounter (HOSPITAL_COMMUNITY)
Admission: RE | Admit: 2014-06-13 | Discharge: 2014-06-13 | Disposition: A | Discharge status: Home / Self Care | Payer: Medicare HMO | Source: Skilled Nursing Facility | Attending: Internal Medicine | Admitting: Internal Medicine

## 2014-06-13 DIAGNOSIS — I350 Nonrheumatic aortic (valve) stenosis: Secondary | ICD-10-CM

## 2014-06-13 DIAGNOSIS — R109 Unspecified abdominal pain: Secondary | ICD-10-CM | POA: Insufficient documentation

## 2014-06-13 DIAGNOSIS — K5900 Constipation, unspecified: Secondary | ICD-10-CM | POA: Diagnosis not present

## 2014-06-13 DIAGNOSIS — J189 Pneumonia, unspecified organism: Secondary | ICD-10-CM | POA: Diagnosis not present

## 2014-06-13 DIAGNOSIS — R601 Generalized edema: Secondary | ICD-10-CM | POA: Diagnosis not present

## 2014-06-13 DIAGNOSIS — D6489 Other specified anemias: Secondary | ICD-10-CM

## 2014-06-13 LAB — BASIC METABOLIC PANEL
Anion gap: 6 (ref 5–15)
BUN: 26 mg/dL — AB (ref 6–23)
CHLORIDE: 110 mmol/L (ref 96–112)
CO2: 21 mmol/L (ref 19–32)
Calcium: 8.2 mg/dL — ABNORMAL LOW (ref 8.4–10.5)
Creatinine, Ser: 1.32 mg/dL — ABNORMAL HIGH (ref 0.50–1.10)
GFR calc Af Amer: 42 mL/min — ABNORMAL LOW (ref >90)
GFR calc non Af Amer: 36 mL/min — ABNORMAL LOW (ref >90)
Glucose, Bld: 124 mg/dL — ABNORMAL HIGH (ref 70–99)
POTASSIUM: 4.2 mmol/L (ref 3.5–5.1)
Sodium: 137 mmol/L (ref 135–145)

## 2014-06-13 LAB — CBC WITH DIFFERENTIAL/PLATELET
Basophils Absolute: 0 10*3/uL (ref 0.0–0.1)
Basophils Relative: 1 % (ref 0–1)
EOS ABS: 0.1 10*3/uL (ref 0.0–0.7)
EOS PCT: 2 % (ref 0–5)
HCT: 29.4 % — ABNORMAL LOW (ref 36.0–46.0)
HEMOGLOBIN: 9.8 g/dL — AB (ref 12.0–15.0)
LYMPHS ABS: 1.1 10*3/uL (ref 0.7–4.0)
LYMPHS PCT: 25 % (ref 12–46)
MCH: 31.6 pg (ref 26.0–34.0)
MCHC: 33.3 g/dL (ref 30.0–36.0)
MCV: 94.8 fL (ref 78.0–100.0)
MONO ABS: 0.5 10*3/uL (ref 0.1–1.0)
MONOS PCT: 11 % (ref 3–12)
NEUTROS ABS: 2.7 10*3/uL (ref 1.7–7.7)
NEUTROS PCT: 61 % (ref 43–77)
Platelets: 85 10*3/uL — ABNORMAL LOW (ref 150–400)
RBC: 3.1 MIL/uL — AB (ref 3.87–5.11)
RDW: 17.6 % — ABNORMAL HIGH (ref 11.5–15.5)
WBC: 4.4 10*3/uL (ref 4.0–10.5)

## 2014-06-13 LAB — PROTEIN / CREATININE RATIO, URINE
CREATININE, URINE: 165.01 mg/dL
Protein Creatinine Ratio: 0.08 (ref 0.00–0.15)
Total Protein, Urine: 14 mg/dL

## 2014-06-14 ENCOUNTER — Encounter: Payer: Self-pay | Admitting: Internal Medicine

## 2014-06-14 ENCOUNTER — Non-Acute Institutional Stay (SKILLED_NURSING_FACILITY): Payer: Medicare HMO | Admitting: Internal Medicine

## 2014-06-14 DIAGNOSIS — I4891 Unspecified atrial fibrillation: Secondary | ICD-10-CM | POA: Diagnosis not present

## 2014-06-14 DIAGNOSIS — R609 Edema, unspecified: Secondary | ICD-10-CM | POA: Diagnosis not present

## 2014-06-14 DIAGNOSIS — N289 Disorder of kidney and ureter, unspecified: Secondary | ICD-10-CM

## 2014-06-14 LAB — GLUCOSE, CAPILLARY
GLUCOSE-CAPILLARY: 132 mg/dL — AB (ref 70–99)
Glucose-Capillary: 142 mg/dL — ABNORMAL HIGH (ref 70–99)

## 2014-06-14 NOTE — Progress Notes (Signed)
Patient ID: Tracey Heath, female   DOB: 1929/04/26, 81 y.o.   MRN: [id number]   This is an acute visit.  Level of care skilled.  [facility].  Chief complaint-acute visit secondary to edema.  History of present illness.  Patient is an 81 year old female who was recently admitted to this facility after hospitalization for pneumonia which apparently resolved.  She also had a 2-D echo which showed moderate to severe aortic stenosis and mild-to-moderate mitral stenosis-showed grade 2 diastolic dysfunction-her ejection fraction was 65-70 percent.  She has a history of atrial fibrillation and had been on Coumadin she was discharged on aspirin.  She also continues on Lopressor twice a day for rate control.  Nursing staff has noted she has significant edema and I been asked to follow-up on this.  She is on low-dose Lasix 20 mg a day it appears this possibly was started in the hospital although information is somewhat unclear.  She did have some mild renal insufficiency in the hospital however her lab done yesterday shows this is improved with that creatinine of 1.32 BUN of 26.  Currently she does not complain of any shortness of breath or chest pain.  Vital signs appear to be stable.  Family medical social history is been reviewed per discharge note on 06/10/2014.  Medications have been reviewed per MAR.  Review of systems.  In general does not complain of fever or chills.  Respiratory is not complaining of shortness of breath or cough.  Cardiac does not complain of chest pain has fairly significant lower extremity edema.  He I does not complain of abdominal discomfort nausea or vomiting diarrhea or constipation.  Muscle skeletal other than weakness does not complain of significant pain.  Neurologic does not complain of dizziness headache or syncopal-type feelings.  Physical exam.  Temperature 98.0 pulse 58 respirations 20 blood pressure 141/66 weight today is  208.2-yesterday was 208.8.  In general this is a somewhat frail elderly female in no distress sitting comfortably in her wheelchair.  Her skin is warm and dry.  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  Chest is clear to auscultation there is no labored breathing.  Heart she does have a 4/6 systolic murmur-again she does have what say to  2- 3+ lower extremity edema.--She also has sacral edema  Abdomen is obese soft nontender positive bowel sounds.  Muscle skeletal does have lower extremity weakness moves her extremities appears at baseline.  Neurologic is grossly intact her speech is clear.  Labs.  06/13/2014.  Sodium 137 potassium 4.2 BUN 26 creatinine 1.32.  WBC 4.4 hemoglobin 9.8 platelets 85,000.  Assessment and plan.  #1-edema-history of diastolic CHF-this was discussed with Dr. Leanord Hawkingobson via phone she appears to have significant edema although clinically appears stable-will increase her Lasix to 40 mg a day also will start potassium 20 mEq a day and recheck a metabolic panel and a BNP on Thursday.  Continue to monitor her weights daily as well as vital signs pulse ox every shift.  #2 renal insufficiency-this appears to have improved somewhat from hospital will have to be monitored with her diuretic use hearing  #3 atrial fibrillation this appears stable rate controlled minimally bradycardic on exam today at this point continue to monitor--on Lopressor for rate control aspirin for anticoagulation  [id number]
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
## 2014-06-15 ENCOUNTER — Encounter (HOSPITAL_COMMUNITY)
Admission: RE | Admit: 2014-06-15 | Discharge: 2014-06-15 | Disposition: A | Discharge status: Home / Self Care | Payer: Medicare HMO | Source: Ambulatory Visit | Attending: Internal Medicine | Admitting: Internal Medicine

## 2014-06-15 ENCOUNTER — Non-Acute Institutional Stay (INDEPENDENT_AMBULATORY_CARE_PROVIDER_SITE_OTHER): Payer: Medicare HMO | Admitting: Internal Medicine

## 2014-06-15 DIAGNOSIS — S81802A Unspecified open wound, left lower leg, initial encounter: Secondary | ICD-10-CM

## 2014-06-15 DIAGNOSIS — I350 Nonrheumatic aortic (valve) stenosis: Secondary | ICD-10-CM | POA: Diagnosis not present

## 2014-06-15 DIAGNOSIS — R609 Edema, unspecified: Secondary | ICD-10-CM

## 2014-06-15 LAB — URINE MICROSCOPIC-ADD ON

## 2014-06-15 LAB — URINALYSIS, ROUTINE W REFLEX MICROSCOPIC
Bilirubin Urine: NEGATIVE
GLUCOSE, UA: NEGATIVE mg/dL
Ketones, ur: NEGATIVE mg/dL
Leukocytes, UA: NEGATIVE
Nitrite: NEGATIVE
PROTEIN: NEGATIVE mg/dL
Specific Gravity, Urine: 1.025 (ref 1.005–1.030)
Urobilinogen, UA: 0.2 mg/dL (ref 0.0–1.0)
pH: 5 (ref 5.0–8.0)

## 2014-06-16 ENCOUNTER — Other Ambulatory Visit (HOSPITAL_COMMUNITY)
Admission: AD | Admit: 2014-06-16 | Discharge: 2014-06-16 | Disposition: A | Discharge status: Home / Self Care | Payer: Medicare HMO | Source: Skilled Nursing Facility | Attending: Internal Medicine | Admitting: Internal Medicine

## 2014-06-16 LAB — BASIC METABOLIC PANEL
Anion gap: 7 (ref 5–15)
BUN: 39 mg/dL — ABNORMAL HIGH (ref 6–23)
CHLORIDE: 107 mmol/L (ref 96–112)
CO2: 21 mmol/L (ref 19–32)
CREATININE: 1.69 mg/dL — AB (ref 0.50–1.10)
Calcium: 8.6 mg/dL (ref 8.4–10.5)
GFR calc Af Amer: 31 mL/min — ABNORMAL LOW (ref >90)
GFR calc non Af Amer: 27 mL/min — ABNORMAL LOW (ref >90)
GLUCOSE: 131 mg/dL — AB (ref 70–99)
POTASSIUM: 4.5 mmol/L (ref 3.5–5.1)
Sodium: 135 mmol/L (ref 135–145)

## 2014-06-16 LAB — BRAIN NATRIURETIC PEPTIDE: B Natriuretic Peptide: 662 pg/mL — ABNORMAL HIGH (ref 0.0–100.0)

## 2014-06-16 LAB — CBC
HEMATOCRIT: 27.3 % — AB (ref 36.0–46.0)
Hemoglobin: 9.2 g/dL — ABNORMAL LOW (ref 12.0–15.0)
MCH: 31.6 pg (ref 26.0–34.0)
MCHC: 33.7 g/dL (ref 30.0–36.0)
MCV: 93.8 fL (ref 78.0–100.0)
Platelets: 78 10*3/uL — ABNORMAL LOW (ref 150–400)
RBC: 2.91 MIL/uL — AB (ref 3.87–5.11)
RDW: 17.2 % — AB (ref 11.5–15.5)
WBC: 3.3 10*3/uL — ABNORMAL LOW (ref 4.0–10.5)

## 2014-06-17 ENCOUNTER — Non-Acute Institutional Stay (SKILLED_NURSING_FACILITY): Payer: Medicare HMO | Admitting: Internal Medicine

## 2014-06-17 DIAGNOSIS — D649 Anemia, unspecified: Secondary | ICD-10-CM

## 2014-06-17 DIAGNOSIS — R609 Edema, unspecified: Secondary | ICD-10-CM

## 2014-06-17 DIAGNOSIS — N289 Disorder of kidney and ureter, unspecified: Secondary | ICD-10-CM

## 2014-06-17 LAB — URINE CULTURE: Colony Count: 8000

## 2014-06-17 NOTE — Progress Notes (Signed)
Patient ID: Tracey Heath, female   DOB: [birth year], 81 y.o.   MRN: [id number]    is an acute visit.  Level of care skilled.  Facility [facility].  Chief complaint-acute visit secondary to follow-up edema-renal insufficiency.  History of present illness.  Patient is an 81 year old female who was recently admitted to this facility after hospitalization for pneumonia which apparently resolved.  She also had a 2-D echo which showed moderate to severe aortic stenosis and mild-to-moderate mitral stenosis-showed grade 2 diastolic dysfunction-her ejection fraction was 65-70 percent.  She has a history of atrial fibrillation and had been on Coumadin she was discharged on aspirin.  She also continues on Lopressor twice a day for rate control.  Nursing staff had noted she has significant edema and her Lasix was increased to 40 mg a day from 20 mg a day-it appears her weight has stabilized at around 208 the last few days.   .  She did have some mild renal insufficiency in the hospital however her lab initially in the facility showed improved with that creatinine of 1.32 BUN of 26.  However updated lab done on March 17 shows creatinine has gone up to 1.69 BUN of 39.  According in nursing she is eating and drinking fairly well especially taking her fluids.  Apparently she also had some blood-tinged urine several days ago-urine culture is pending but so far appears to be fairly benign.  Her hemoglobin yesterday was 9.2 it appears her baseline is the high eights low nines recently.    Currently she does not complain of any shortness of breath or chest pain.  Vital signs appear to be stable.  Family medical social history is been reviewed per discharge note on 06/10/2014.  Medications have been reviewed per MAR.  Review of systems.  In general does not complain of fever or chills.  Respiratory is not complaining of shortness of breath or cough.  Cardiac does not complain of  chest pain has fairly significant lower extremity edema.  He I does not complain of abdominal discomfort nausea or vomiting diarrhea or constipation.  Muscle skeletal other than weakness does not complain of significant pain.  Neurologic does not complain of dizziness headache or syncopal-type feelings.  Physical exam.   Temperature 97.1 pulse 69 respirations 18 blood pressure 122/56 weight is 208 this is been stable for the past 3 days  In general this is a somewhat frail elderly female in no distress sitting comfortably in her wheelchair.  Her skin is warm and dry.  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  Chest is clear to auscultation there is no labored breathing.  Heart she does have a 4/6 systolic murmur-again she does have what say 2plus lower extremity edema.-This appears possibly slightly improved from previous exam she also appears to have slightly improved sacral edema  Abdomen is obese soft nontender positive bowel sounds.  Muscle skeletal does have lower extremity weakness moves her extremities appears at baseline.  Neurologic is grossly intact her speech is clear.  Labs.  06/16/2014.  Sodium 135 potassium 4.5 BUN 39 creatinine 1.69.  WBC 3.3 hemoglobin 9.2 platelets 78,000 which has been her baseline  BNP was in the 600s  06/13/2014.  Sodium 137 potassium 4.2 BUN 26 creatinine 1.32.  WBC 4.4 hemoglobin 9.8 platelets 85,000.  Assessment and plan.  #1-edema-history of diastolic CHF-this appears to be slightly improved however this is complicated with her history of renal insufficiency-we will decrease her Lasix slightly to 40  mg Monday Wednesday Friday 20 mg all other days and continue to monitor her weights and clinical status closely.  Also will update a metabolic panel tomorrow as well as first laboratory daynext wee  We'll continue the potassium only on Monday Wednesday Friday    Continue to monitor her weights daily as well as vital signs pulse ox  every shift.  #2 renal insufficiency As stated above creatinine appears to be slowly rising will  Reduce the diuretic as noted above and monitor  #3 atrial fibrillation this appears stable rate controlled  at this point continue to monitor--on Lopressor for rate control aspirin for anticoagulation  #4-anemia-hemoglobin appears to be relatively baseline but will have to be monitored closely will update a CBC  [id number]

## 2014-06-18 ENCOUNTER — Inpatient Hospital Stay (HOSPITAL_COMMUNITY): Admit: 2014-06-18 | Payer: Self-pay

## 2014-06-18 ENCOUNTER — Encounter: Payer: Self-pay | Admitting: Internal Medicine

## 2014-06-18 ENCOUNTER — Encounter (HOSPITAL_COMMUNITY)
Admission: RE | Admit: 2014-06-18 | Discharge: 2014-06-18 | Disposition: A | Discharge status: Home / Self Care | Payer: Medicare HMO | Source: Skilled Nursing Facility | Attending: Internal Medicine | Admitting: Internal Medicine

## 2014-06-18 LAB — CBC WITH DIFFERENTIAL/PLATELET
Basophils Absolute: 0 10*3/uL (ref 0.0–0.1)
Basophils Relative: 0 % (ref 0–1)
Eosinophils Absolute: 0.1 10*3/uL (ref 0.0–0.7)
Eosinophils Relative: 2 % (ref 0–5)
HEMATOCRIT: 26.5 % — AB (ref 36.0–46.0)
Hemoglobin: 8.8 g/dL — ABNORMAL LOW (ref 12.0–15.0)
Lymphocytes Relative: 23 % (ref 12–46)
Lymphs Abs: 0.7 10*3/uL (ref 0.7–4.0)
MCH: 31.7 pg (ref 26.0–34.0)
MCHC: 33.2 g/dL (ref 30.0–36.0)
MCV: 95.3 fL (ref 78.0–100.0)
Monocytes Absolute: 0.4 10*3/uL (ref 0.1–1.0)
Monocytes Relative: 13 % — ABNORMAL HIGH (ref 3–12)
Neutro Abs: 2.1 10*3/uL (ref 1.7–7.7)
Neutrophils Relative %: 63 % (ref 43–77)
PLATELETS: 72 10*3/uL — AB (ref 150–400)
RBC: 2.78 MIL/uL — ABNORMAL LOW (ref 3.87–5.11)
RDW: 17.6 % — ABNORMAL HIGH (ref 11.5–15.5)
WBC: 3.3 10*3/uL — AB (ref 4.0–10.5)

## 2014-06-18 LAB — BASIC METABOLIC PANEL
ANION GAP: 7 (ref 5–15)
BUN: 46 mg/dL — ABNORMAL HIGH (ref 6–23)
CO2: 21 mmol/L (ref 19–32)
Calcium: 8.7 mg/dL (ref 8.4–10.5)
Chloride: 110 mmol/L (ref 96–112)
Creatinine, Ser: 1.69 mg/dL — ABNORMAL HIGH (ref 0.50–1.10)
GFR calc non Af Amer: 27 mL/min — ABNORMAL LOW (ref >90)
GFR, EST AFRICAN AMERICAN: 31 mL/min — AB (ref >90)
GLUCOSE: 140 mg/dL — AB (ref 70–99)
Potassium: 4.5 mmol/L (ref 3.5–5.1)
SODIUM: 138 mmol/L (ref 135–145)

## 2014-06-18 LAB — WOUND CULTURE

## 2014-06-19 ENCOUNTER — Inpatient Hospital Stay (HOSPITAL_COMMUNITY): Payer: Medicare HMO | Attending: Internal Medicine

## 2014-06-19 ENCOUNTER — Emergency Department (HOSPITAL_COMMUNITY): Payer: Medicare HMO

## 2014-06-19 ENCOUNTER — Encounter (HOSPITAL_COMMUNITY): Payer: Self-pay | Admitting: Emergency Medicine

## 2014-06-19 ENCOUNTER — Inpatient Hospital Stay (HOSPITAL_COMMUNITY)
Admission: EM | Admit: 2014-06-19 | Discharge: 2014-06-23 | DRG: 854 | Disposition: A | Discharge status: Hospice | Payer: Medicare HMO | Attending: Family Medicine | Admitting: Family Medicine

## 2014-06-19 DIAGNOSIS — I4891 Unspecified atrial fibrillation: Secondary | ICD-10-CM | POA: Diagnosis present

## 2014-06-19 DIAGNOSIS — I480 Paroxysmal atrial fibrillation: Secondary | ICD-10-CM | POA: Diagnosis present

## 2014-06-19 DIAGNOSIS — Z66 Do not resuscitate: Secondary | ICD-10-CM | POA: Diagnosis present

## 2014-06-19 DIAGNOSIS — D6959 Other secondary thrombocytopenia: Secondary | ICD-10-CM | POA: Diagnosis present

## 2014-06-19 DIAGNOSIS — R188 Other ascites: Secondary | ICD-10-CM | POA: Insufficient documentation

## 2014-06-19 DIAGNOSIS — R17 Unspecified jaundice: Secondary | ICD-10-CM | POA: Diagnosis present

## 2014-06-19 DIAGNOSIS — Z880 Allergy status to penicillin: Secondary | ICD-10-CM

## 2014-06-19 DIAGNOSIS — R609 Edema, unspecified: Secondary | ICD-10-CM | POA: Diagnosis present

## 2014-06-19 DIAGNOSIS — Z7901 Long term (current) use of anticoagulants: Secondary | ICD-10-CM

## 2014-06-19 DIAGNOSIS — K429 Umbilical hernia without obstruction or gangrene: Secondary | ICD-10-CM

## 2014-06-19 DIAGNOSIS — E872 Acidosis: Secondary | ICD-10-CM | POA: Diagnosis present

## 2014-06-19 DIAGNOSIS — D696 Thrombocytopenia, unspecified: Secondary | ICD-10-CM | POA: Diagnosis present

## 2014-06-19 DIAGNOSIS — E7220 Disorder of urea cycle metabolism, unspecified: Secondary | ICD-10-CM | POA: Diagnosis present

## 2014-06-19 DIAGNOSIS — Z7982 Long term (current) use of aspirin: Secondary | ICD-10-CM

## 2014-06-19 DIAGNOSIS — R296 Repeated falls: Secondary | ICD-10-CM | POA: Diagnosis present

## 2014-06-19 DIAGNOSIS — D501 Sideropenic dysphagia: Secondary | ICD-10-CM

## 2014-06-19 DIAGNOSIS — R4182 Altered mental status, unspecified: Secondary | ICD-10-CM | POA: Diagnosis not present

## 2014-06-19 DIAGNOSIS — N183 Chronic kidney disease, stage 3 unspecified: Secondary | ICD-10-CM | POA: Diagnosis present

## 2014-06-19 DIAGNOSIS — A419 Sepsis, unspecified organism: Principal | ICD-10-CM | POA: Diagnosis present

## 2014-06-19 DIAGNOSIS — D649 Anemia, unspecified: Secondary | ICD-10-CM | POA: Diagnosis present

## 2014-06-19 DIAGNOSIS — T68XXXA Hypothermia, initial encounter: Secondary | ICD-10-CM

## 2014-06-19 DIAGNOSIS — Z885 Allergy status to narcotic agent status: Secondary | ICD-10-CM

## 2014-06-19 DIAGNOSIS — Z886 Allergy status to analgesic agent status: Secondary | ICD-10-CM

## 2014-06-19 DIAGNOSIS — K7460 Unspecified cirrhosis of liver: Secondary | ICD-10-CM | POA: Diagnosis present

## 2014-06-19 DIAGNOSIS — E119 Type 2 diabetes mellitus without complications: Secondary | ICD-10-CM

## 2014-06-19 DIAGNOSIS — K7682 Hepatic encephalopathy: Secondary | ICD-10-CM | POA: Insufficient documentation

## 2014-06-19 DIAGNOSIS — R400 Somnolence: Secondary | ICD-10-CM

## 2014-06-19 DIAGNOSIS — K7581 Nonalcoholic steatohepatitis (NASH): Secondary | ICD-10-CM | POA: Diagnosis present

## 2014-06-19 DIAGNOSIS — L97129 Non-pressure chronic ulcer of left thigh with unspecified severity: Secondary | ICD-10-CM | POA: Diagnosis present

## 2014-06-19 DIAGNOSIS — D638 Anemia in other chronic diseases classified elsewhere: Secondary | ICD-10-CM | POA: Diagnosis present

## 2014-06-19 DIAGNOSIS — Z8249 Family history of ischemic heart disease and other diseases of the circulatory system: Secondary | ICD-10-CM

## 2014-06-19 DIAGNOSIS — K7290 Hepatic failure, unspecified without coma: Secondary | ICD-10-CM

## 2014-06-19 DIAGNOSIS — R1310 Dysphagia, unspecified: Secondary | ICD-10-CM

## 2014-06-19 DIAGNOSIS — Z800 Family history of malignant neoplasm of digestive organs: Secondary | ICD-10-CM

## 2014-06-19 DIAGNOSIS — Z882 Allergy status to sulfonamides status: Secondary | ICD-10-CM

## 2014-06-19 DIAGNOSIS — H409 Unspecified glaucoma: Secondary | ICD-10-CM | POA: Diagnosis present

## 2014-06-19 DIAGNOSIS — R531 Weakness: Secondary | ICD-10-CM

## 2014-06-19 DIAGNOSIS — Z91013 Allergy to seafood: Secondary | ICD-10-CM

## 2014-06-19 DIAGNOSIS — Z887 Allergy status to serum and vaccine status: Secondary | ICD-10-CM

## 2014-06-19 DIAGNOSIS — Z79899 Other long term (current) drug therapy: Secondary | ICD-10-CM

## 2014-06-19 LAB — I-STAT CG4 LACTIC ACID, ED: LACTIC ACID, VENOUS: 4.21 mmol/L — AB (ref 0.5–2.0)

## 2014-06-19 LAB — URINALYSIS, ROUTINE W REFLEX MICROSCOPIC
BILIRUBIN URINE: NEGATIVE
Glucose, UA: NEGATIVE mg/dL
KETONES UR: NEGATIVE mg/dL
LEUKOCYTES UA: NEGATIVE
NITRITE: NEGATIVE
PH: 5.5 (ref 5.0–8.0)
PROTEIN: NEGATIVE mg/dL
SPECIFIC GRAVITY, URINE: 1.01 (ref 1.005–1.030)
Urobilinogen, UA: 0.2 mg/dL (ref 0.0–1.0)

## 2014-06-19 LAB — URINE MICROSCOPIC-ADD ON

## 2014-06-19 MED ORDER — LEVOFLOXACIN IN D5W 750 MG/150ML IV SOLN
750.0000 mg | Freq: Once | INTRAVENOUS | Status: AC
  Administered 2014-06-20: 750 mg via INTRAVENOUS
  Filled 2014-06-19: qty 150

## 2014-06-19 MED ORDER — DEXTROSE 5 % IV SOLN
2.0000 g | Freq: Once | INTRAVENOUS | Status: AC
  Administered 2014-06-20: 2 g via INTRAVENOUS
  Filled 2014-06-19: qty 2

## 2014-06-19 MED ORDER — SODIUM CHLORIDE 0.9 % IV BOLUS (SEPSIS)
1000.0000 mL | INTRAVENOUS | Status: DC
  Administered 2014-06-19 – 2014-06-20 (×2): 1000 mL via INTRAVENOUS

## 2014-06-19 MED ORDER — VANCOMYCIN HCL IN DEXTROSE 1-5 GM/200ML-% IV SOLN
1000.0000 mg | Freq: Once | INTRAVENOUS | Status: AC
  Administered 2014-06-20: 1000 mg via INTRAVENOUS

## 2014-06-19 NOTE — ED Notes (Signed)
[facility] is from [facility]. Patient brought to ED for evaluation of altered mental status. [facility] staff reports patient has been more lethargic all day. States they fed her dinner tonight but she has been vomiting this evening.

## 2014-06-19 NOTE — Progress Notes (Signed)
Patient ID: Tracey Heath, female   DOB: 02/01/1930, 81 y.o.   MRN: [id number]                PROGRESS NOTE  DATE:  06/15/2014                 FACILITY: [facility]                LEVEL OF CARE:   SNF   Acute Visit                            CHIEF COMPLAINT:  Follow up medical issues, including edema and left lateral thigh wound.       HISTORY OF PRESENT ILLNESS:  This is an 81 year-old woman whom I admitted to the facility two days ago.  She was noted to have a generalized decline in her primary physician's office, and repeated falls.   She was admitted to hospital for this issue.    She was found to have a left lower lobe infiltrate on chest x-ray.    Also noted to have profound anemia as well as thrombocytopenia.  She was transfused.  At discharge, her hemoglobin was 9.8, white count 5.4, platelet count 85,000.  Her initial admission hemoglobin had been 7.6.    She had an echocardiogram which I have reviewed.  This showed moderate to severe calcific aortic stenosis with a valve area of 0.91.  She had grade 2 diastolic dysfunction.  She had a mitral valve that was also moderately to severely calcified, mild mitral stenosis.  Her pulmonary artery pressure was decreased at 48.     It was noted on admission that she had a wound on her left lateral thigh with surrounding erythema.  There was black adherent eschar on top of this.   I put her on empiric antibiotics the other day and I am following up on this for debridement.    PHYSICAL EXAMINATION:   GENERAL APPEARANCE:  The patient is not in any distress.   However, she looks fatigued.  She appears to be cognitively intact.     CHEST/RESPIRATORY:  Shallow, but otherwise clear air entry.        CARDIOVASCULAR:   CARDIAC:  She has a harsh systolic ejection murmur heard all over the precordium.  JVP is not elevated.    EDEMA/VARICOSITIES:  She has edema all the way up both legs, extending into her flanks and mid back.       GASTROINTESTINAL:   ABDOMEN:  Distended.  However, bowel sounds are positive.  Two views that I ordered of the abdomen showed diffuse stool throughout the colon, compatible with impaction.    LABORATORY DATA:     Her hemoglobin is stable at 9.8, platelets 85,000.  Her white count is 4.4 with a normal differential.     Sodium is 137, potassium 4.2, BUN 26, creatinine 1.32.       ASSESSMENT/PLAN:                                       Diffuse edema.  Notable that her albumin was 2.1 on 06/07/2014.    It may be that all of this is related to her severe valvular disease.  She certainly looks fatigued, but is not in overt left ventricular failure.    I do not  think it would hurt to try and gently increase her Lasix to perhaps 20 mg twice a day and follow her weights.  I would be cautious about over-diuresing her.    Anemia and thrombocytopenia.  I suspect this is probably myelodysplastic syndrome, but I would have no plans to further work this up.  Her hemoglobin and platelet count appear to be stable.    Wound on the left lateral thigh that appears to be infected.  I injected the patient with 1% lidocaine and did a surgical debridement of the eschar over this area.  This surprisingly cleaned up quite well.  I did a surface culture here, and a Santyl-based dressing should continue.  The patient states that this was initially trauma.  There seems very little reason to doubt this.  This may have been an infected hematoma at one point.      CPT CODE: 56213
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
## 2014-06-20 ENCOUNTER — Inpatient Hospital Stay (HOSPITAL_COMMUNITY): Payer: Medicare HMO

## 2014-06-20 ENCOUNTER — Encounter (HOSPITAL_COMMUNITY): Payer: Self-pay | Admitting: Internal Medicine

## 2014-06-20 DIAGNOSIS — A419 Sepsis, unspecified organism: Secondary | ICD-10-CM | POA: Diagnosis present

## 2014-06-20 DIAGNOSIS — D696 Thrombocytopenia, unspecified: Secondary | ICD-10-CM

## 2014-06-20 DIAGNOSIS — Z7901 Long term (current) use of anticoagulants: Secondary | ICD-10-CM | POA: Diagnosis not present

## 2014-06-20 DIAGNOSIS — K7581 Nonalcoholic steatohepatitis (NASH): Secondary | ICD-10-CM | POA: Diagnosis present

## 2014-06-20 DIAGNOSIS — T68XXXA Hypothermia, initial encounter: Secondary | ICD-10-CM | POA: Diagnosis present

## 2014-06-20 DIAGNOSIS — K7682 Hepatic encephalopathy: Secondary | ICD-10-CM | POA: Insufficient documentation

## 2014-06-20 DIAGNOSIS — N183 Chronic kidney disease, stage 3 unspecified: Secondary | ICD-10-CM | POA: Diagnosis present

## 2014-06-20 DIAGNOSIS — K7290 Hepatic failure, unspecified without coma: Secondary | ICD-10-CM

## 2014-06-20 DIAGNOSIS — Z882 Allergy status to sulfonamides status: Secondary | ICD-10-CM | POA: Diagnosis not present

## 2014-06-20 DIAGNOSIS — H409 Unspecified glaucoma: Secondary | ICD-10-CM | POA: Diagnosis present

## 2014-06-20 DIAGNOSIS — R4182 Altered mental status, unspecified: Secondary | ICD-10-CM | POA: Diagnosis present

## 2014-06-20 DIAGNOSIS — Z800 Family history of malignant neoplasm of digestive organs: Secondary | ICD-10-CM | POA: Diagnosis not present

## 2014-06-20 DIAGNOSIS — Z887 Allergy status to serum and vaccine status: Secondary | ICD-10-CM | POA: Diagnosis not present

## 2014-06-20 DIAGNOSIS — D638 Anemia in other chronic diseases classified elsewhere: Secondary | ICD-10-CM | POA: Diagnosis present

## 2014-06-20 DIAGNOSIS — E7220 Disorder of urea cycle metabolism, unspecified: Secondary | ICD-10-CM | POA: Diagnosis present

## 2014-06-20 DIAGNOSIS — Z7982 Long term (current) use of aspirin: Secondary | ICD-10-CM | POA: Diagnosis not present

## 2014-06-20 DIAGNOSIS — R296 Repeated falls: Secondary | ICD-10-CM | POA: Diagnosis present

## 2014-06-20 DIAGNOSIS — Z91013 Allergy to seafood: Secondary | ICD-10-CM | POA: Diagnosis not present

## 2014-06-20 DIAGNOSIS — R188 Other ascites: Secondary | ICD-10-CM | POA: Diagnosis not present

## 2014-06-20 DIAGNOSIS — Z79899 Other long term (current) drug therapy: Secondary | ICD-10-CM | POA: Diagnosis not present

## 2014-06-20 DIAGNOSIS — Z66 Do not resuscitate: Secondary | ICD-10-CM | POA: Diagnosis present

## 2014-06-20 DIAGNOSIS — I480 Paroxysmal atrial fibrillation: Secondary | ICD-10-CM | POA: Diagnosis present

## 2014-06-20 DIAGNOSIS — K429 Umbilical hernia without obstruction or gangrene: Secondary | ICD-10-CM | POA: Diagnosis present

## 2014-06-20 DIAGNOSIS — L97129 Non-pressure chronic ulcer of left thigh with unspecified severity: Secondary | ICD-10-CM | POA: Diagnosis present

## 2014-06-20 DIAGNOSIS — Z885 Allergy status to narcotic agent status: Secondary | ICD-10-CM | POA: Diagnosis not present

## 2014-06-20 DIAGNOSIS — Z880 Allergy status to penicillin: Secondary | ICD-10-CM | POA: Diagnosis not present

## 2014-06-20 DIAGNOSIS — D649 Anemia, unspecified: Secondary | ICD-10-CM | POA: Diagnosis not present

## 2014-06-20 DIAGNOSIS — K7460 Unspecified cirrhosis of liver: Secondary | ICD-10-CM | POA: Diagnosis not present

## 2014-06-20 DIAGNOSIS — E872 Acidosis: Secondary | ICD-10-CM | POA: Diagnosis present

## 2014-06-20 DIAGNOSIS — E119 Type 2 diabetes mellitus without complications: Secondary | ICD-10-CM | POA: Diagnosis present

## 2014-06-20 DIAGNOSIS — Z8249 Family history of ischemic heart disease and other diseases of the circulatory system: Secondary | ICD-10-CM | POA: Diagnosis not present

## 2014-06-20 DIAGNOSIS — D6959 Other secondary thrombocytopenia: Secondary | ICD-10-CM | POA: Diagnosis present

## 2014-06-20 DIAGNOSIS — Z886 Allergy status to analgesic agent status: Secondary | ICD-10-CM | POA: Diagnosis not present

## 2014-06-20 DIAGNOSIS — R17 Unspecified jaundice: Secondary | ICD-10-CM

## 2014-06-20 LAB — COMPREHENSIVE METABOLIC PANEL
ALT: 18 U/L (ref 0–35)
ALT: 15 U/L (ref 0–35)
AST: 35 U/L (ref 0–37)
AST: 27 U/L (ref 0–37)
Albumin: 2.6 g/dL — ABNORMAL LOW (ref 3.5–5.2)
Albumin: 1.9 g/dL — ABNORMAL LOW (ref 3.5–5.2)
Alkaline Phosphatase: 60 U/L (ref 39–117)
Alkaline Phosphatase: 45 U/L (ref 39–117)
Anion gap: 13 (ref 5–15)
Anion gap: 9 (ref 5–15)
BUN: 51 mg/dL — AB (ref 6–23)
BUN: 50 mg/dL — ABNORMAL HIGH (ref 6–23)
CALCIUM: 8.3 mg/dL — AB (ref 8.4–10.5)
CHLORIDE: 105 mmol/L (ref 96–112)
CO2: 21 mmol/L (ref 19–32)
CO2: 21 mmol/L (ref 19–32)
CREATININE: 1.74 mg/dL — AB (ref 0.50–1.10)
Calcium: 9 mg/dL (ref 8.4–10.5)
Chloride: 109 mmol/L (ref 96–112)
Creatinine, Ser: 1.72 mg/dL — ABNORMAL HIGH (ref 0.50–1.10)
GFR calc Af Amer: 30 mL/min — ABNORMAL LOW (ref >90)
GFR calc non Af Amer: 26 mL/min — ABNORMAL LOW (ref >90)
GFR, EST AFRICAN AMERICAN: 31 mL/min — AB (ref >90)
GFR, EST NON AFRICAN AMERICAN: 26 mL/min — AB (ref >90)
GLUCOSE: 178 mg/dL — AB (ref 70–99)
Glucose, Bld: 171 mg/dL — ABNORMAL HIGH (ref 70–99)
Potassium: 4.9 mmol/L (ref 3.5–5.1)
Potassium: 4.1 mmol/L (ref 3.5–5.1)
SODIUM: 139 mmol/L (ref 135–145)
Sodium: 139 mmol/L (ref 135–145)
TOTAL PROTEIN: 5.3 g/dL — AB (ref 6.0–8.3)
Total Bilirubin: 2.6 mg/dL — ABNORMAL HIGH (ref 0.3–1.2)
Total Bilirubin: 1.8 mg/dL — ABNORMAL HIGH (ref 0.3–1.2)
Total Protein: 7 g/dL (ref 6.0–8.3)

## 2014-06-20 LAB — AMMONIA
Ammonia: 87 umol/L — ABNORMAL HIGH (ref 11–32)
Ammonia: 53 umol/L — ABNORMAL HIGH (ref 11–32)

## 2014-06-20 LAB — BODY FLUID CELL COUNT WITH DIFFERENTIAL
Eos, Fluid: 0 %
Lymphs, Fluid: 35 %
Monocyte-Macrophage-Serous Fluid: 56 % (ref 50–90)
Neutrophil Count, Fluid: 9 % (ref 0–25)
Total Nucleated Cell Count, Fluid: 69 cu mm (ref 0–1000)

## 2014-06-20 LAB — CBC
HEMATOCRIT: 24.5 % — AB (ref 36.0–46.0)
Hemoglobin: 8.2 g/dL — ABNORMAL LOW (ref 12.0–15.0)
MCH: 31.3 pg (ref 26.0–34.0)
MCHC: 33.5 g/dL (ref 30.0–36.0)
MCV: 93.5 fL (ref 78.0–100.0)
Platelets: 73 10*3/uL — ABNORMAL LOW (ref 150–400)
RBC: 2.62 MIL/uL — ABNORMAL LOW (ref 3.87–5.11)
RDW: 17.7 % — ABNORMAL HIGH (ref 11.5–15.5)
WBC: 5.1 10*3/uL (ref 4.0–10.5)

## 2014-06-20 LAB — CBC WITH DIFFERENTIAL/PLATELET
Basophils Absolute: 0 10*3/uL (ref 0.0–0.1)
Basophils Relative: 0 % (ref 0–1)
EOS PCT: 1 % (ref 0–5)
Eosinophils Absolute: 0 10*3/uL (ref 0.0–0.7)
HCT: 31.2 % — ABNORMAL LOW (ref 36.0–46.0)
Hemoglobin: 10.6 g/dL — ABNORMAL LOW (ref 12.0–15.0)
LYMPHS ABS: 0.8 10*3/uL (ref 0.7–4.0)
LYMPHS PCT: 12 % (ref 12–46)
MCH: 32.2 pg (ref 26.0–34.0)
MCHC: 34 g/dL (ref 30.0–36.0)
MCV: 94.8 fL (ref 78.0–100.0)
MONO ABS: 0.4 10*3/uL (ref 0.1–1.0)
MONOS PCT: 5 % (ref 3–12)
NEUTROS ABS: 5.5 10*3/uL (ref 1.7–7.7)
NEUTROS PCT: 82 % — AB (ref 43–77)
PLATELETS: 115 10*3/uL — AB (ref 150–400)
RBC: 3.29 MIL/uL — ABNORMAL LOW (ref 3.87–5.11)
RDW: 17.8 % — AB (ref 11.5–15.5)
WBC: 6.6 10*3/uL (ref 4.0–10.5)

## 2014-06-20 LAB — BILIRUBIN, FRACTIONATED(TOT/DIR/INDIR)
BILIRUBIN DIRECT: 0.4 mg/dL (ref 0.0–0.5)
BILIRUBIN INDIRECT: 1.5 mg/dL — AB (ref 0.3–0.9)
Total Bilirubin: 1.9 mg/dL — ABNORMAL HIGH (ref 0.3–1.2)

## 2014-06-20 LAB — GLUCOSE, CAPILLARY
GLUCOSE-CAPILLARY: 131 mg/dL — AB (ref 70–99)
Glucose-Capillary: 144 mg/dL — ABNORMAL HIGH (ref 70–99)
Glucose-Capillary: 146 mg/dL — ABNORMAL HIGH (ref 70–99)
Glucose-Capillary: 128 mg/dL — ABNORMAL HIGH (ref 70–99)

## 2014-06-20 LAB — HEPATITIS PANEL, ACUTE
HCV AB: NEGATIVE
HEP B S AG: NEGATIVE
Hep A IgM: NONREACTIVE
Hep B C IgM: NONREACTIVE

## 2014-06-20 LAB — PROTIME-INR
INR: 1.86 — AB (ref 0.00–1.49)
Prothrombin Time: 21.6 seconds — ABNORMAL HIGH (ref 11.6–15.2)

## 2014-06-20 LAB — TROPONIN I: Troponin I: <0.03 ng/mL (ref <0.031)

## 2014-06-20 LAB — MRSA PCR SCREENING: MRSA BY PCR: NEGATIVE

## 2014-06-20 MED ORDER — BRIMONIDINE TARTRATE 0.15 % OP SOLN
1.0000 [drp] | Freq: Two times a day (BID) | OPHTHALMIC | Status: DC
  Administered 2014-06-20 – 2014-06-23 (×7): 1 [drp] via OPHTHALMIC
  Filled 2014-06-20: qty 5

## 2014-06-20 MED ORDER — K PHOS MONO-SOD PHOS DI & MONO 155-852-130 MG PO TABS
250.0000 mg | ORAL_TABLET | Freq: Three times a day (TID) | ORAL | Status: DC
  Administered 2014-06-21 – 2014-06-23 (×9): 250 mg via ORAL
  Filled 2014-06-20 (×18): qty 1

## 2014-06-20 MED ORDER — LEVOTHYROXINE SODIUM 50 MCG PO TABS
50.0000 ug | ORAL_TABLET | Freq: Every day | ORAL | Status: DC
  Administered 2014-06-22 – 2014-06-23 (×2): 50 ug via ORAL
  Filled 2014-06-20 (×2): qty 1

## 2014-06-20 MED ORDER — ONDANSETRON HCL 4 MG/2ML IJ SOLN: 4.0000 mg | Freq: Four times a day (QID) | INTRAMUSCULAR | Status: DC | PRN

## 2014-06-20 MED ORDER — ONDANSETRON HCL 4 MG PO TABS: 4.0000 mg | ORAL_TABLET | Freq: Four times a day (QID) | ORAL | Status: DC | PRN

## 2014-06-20 MED ORDER — VANCOMYCIN HCL 500 MG IV SOLR
500.0000 mg | Freq: Once | INTRAVENOUS | Status: AC
  Administered 2014-06-20: 500 mg via INTRAVENOUS
  Filled 2014-06-20: qty 500

## 2014-06-20 MED ORDER — DORZOLAMIDE HCL-TIMOLOL MAL 2-0.5 % OP SOLN
1.0000 [drp] | Freq: Two times a day (BID) | OPHTHALMIC | Status: DC
  Administered 2014-06-20 – 2014-06-23 (×7): 1 [drp] via OPHTHALMIC
  Filled 2014-06-20: qty 10

## 2014-06-20 MED ORDER — CETYLPYRIDINIUM CHLORIDE 0.05 % MT LIQD
7.0000 mL | Freq: Two times a day (BID) | OROMUCOSAL | Status: DC
  Administered 2014-06-20 – 2014-06-23 (×7): 7 mL via OROMUCOSAL

## 2014-06-20 MED ORDER — LATANOPROST 0.005 % OP SOLN
1.0000 [drp] | Freq: Every day | OPHTHALMIC | Status: DC
  Administered 2014-06-20 – 2014-06-22 (×3): 1 [drp] via OPHTHALMIC
  Filled 2014-06-20: qty 2.5

## 2014-06-20 MED ORDER — INSULIN ASPART 100 UNIT/ML ~~LOC~~ SOLN
0.0000 [IU] | Freq: Three times a day (TID) | SUBCUTANEOUS | Status: DC
  Administered 2014-06-21 – 2014-06-22 (×2): 1 [IU] via SUBCUTANEOUS
  Administered 2014-06-22 – 2014-06-23 (×3): 2 [IU] via SUBCUTANEOUS
  Administered 2014-06-23: 1 [IU] via SUBCUTANEOUS
  Administered 2014-06-23: 2 [IU] via SUBCUTANEOUS

## 2014-06-20 MED ORDER — POTASSIUM CHLORIDE CRYS ER 20 MEQ PO TBCR
20.0000 meq | EXTENDED_RELEASE_TABLET | ORAL | Status: DC
  Administered 2014-06-23: 20 meq via ORAL
  Filled 2014-06-20: qty 1

## 2014-06-20 MED ORDER — LEVOFLOXACIN IN D5W 750 MG/150ML IV SOLN
750.0000 mg | INTRAVENOUS | Status: DC
  Administered 2014-06-21: 750 mg via INTRAVENOUS
  Filled 2014-06-20 (×3): qty 150

## 2014-06-20 MED ORDER — ASPIRIN EC 81 MG PO TBEC
81.0000 mg | DELAYED_RELEASE_TABLET | Freq: Every day | ORAL | Status: DC
  Administered 2014-06-22 – 2014-06-23 (×2): 81 mg via ORAL
  Filled 2014-06-20 (×3): qty 1

## 2014-06-20 MED ORDER — ENOXAPARIN SODIUM 40 MG/0.4ML ~~LOC~~ SOLN
40.0000 mg | SUBCUTANEOUS | Status: DC
  Administered 2014-06-20: 40 mg via SUBCUTANEOUS
  Filled 2014-06-20: qty 0.4

## 2014-06-20 MED ORDER — VANCOMYCIN HCL 10 G IV SOLR
1250.0000 mg | INTRAVENOUS | Status: DC
  Administered 2014-06-21 – 2014-06-23 (×3): 1250 mg via INTRAVENOUS
  Filled 2014-06-20 (×2): qty 1250
  Filled 2014-06-20: qty 1000
  Filled 2014-06-20: qty 1250

## 2014-06-20 MED ORDER — PROSIGHT PO TABS: 1.0000 | ORAL_TABLET | Freq: Every day | ORAL | Status: DC

## 2014-06-20 MED ORDER — VITAMIN D 1000 UNITS PO TABS
2000.0000 [IU] | ORAL_TABLET | Freq: Every day | ORAL | Status: DC
  Administered 2014-06-22 – 2014-06-23 (×2): 2000 [IU] via ORAL
  Filled 2014-06-20 (×2): qty 2

## 2014-06-20 MED ORDER — FISH OIL 1200 MG PO CAPS: 1.0000 | ORAL_CAPSULE | Freq: Every day | ORAL | Status: DC

## 2014-06-20 MED ORDER — OMEGA-3-ACID ETHYL ESTERS 1 G PO CAPS
1.0000 g | ORAL_CAPSULE | Freq: Every day | ORAL | Status: DC
  Filled 2014-06-20: qty 1

## 2014-06-20 MED ORDER — LACTULOSE 10 GM/15ML PO SOLN
30.0000 g | Freq: Once | ORAL | Status: DC
  Filled 2014-06-20: qty 60

## 2014-06-20 MED ORDER — FUROSEMIDE 10 MG/ML IJ SOLN
20.0000 mg | Freq: Once | INTRAMUSCULAR | Status: AC
  Administered 2014-06-20: 20 mg via INTRAVENOUS
  Filled 2014-06-20: qty 2

## 2014-06-20 MED ORDER — FUROSEMIDE 10 MG/ML IJ SOLN
20.0000 mg | Freq: Two times a day (BID) | INTRAMUSCULAR | Status: DC
  Administered 2014-06-20 – 2014-06-23 (×7): 20 mg via INTRAVENOUS
  Filled 2014-06-20 (×7): qty 2

## 2014-06-20 MED ORDER — BISACODYL 5 MG PO TBEC: 5.0000 mg | DELAYED_RELEASE_TABLET | Freq: Two times a day (BID) | ORAL | Status: DC | PRN

## 2014-06-20 MED ORDER — ACETAMINOPHEN 650 MG RE SUPP: 650.0000 mg | Freq: Four times a day (QID) | RECTAL | Status: DC | PRN

## 2014-06-20 MED ORDER — METOPROLOL TARTRATE 25 MG PO TABS
25.0000 mg | ORAL_TABLET | Freq: Two times a day (BID) | ORAL | Status: DC
  Administered 2014-06-22 – 2014-06-23 (×3): 25 mg via ORAL
  Filled 2014-06-20 (×4): qty 1

## 2014-06-20 MED ORDER — ACETAMINOPHEN 325 MG PO TABS
650.0000 mg | ORAL_TABLET | Freq: Four times a day (QID) | ORAL | Status: DC | PRN
Start: 2014-06-20 — End: 2014-06-23
  Administered 2014-06-22 – 2014-06-23 (×2): 650 mg via ORAL
  Filled 2014-06-20 (×2): qty 2

## 2014-06-20 MED ORDER — AZTREONAM 1 G IJ SOLR
1.0000 g | Freq: Three times a day (TID) | INTRAMUSCULAR | Status: DC
  Administered 2014-06-20 – 2014-06-23 (×10): 1 g via INTRAVENOUS
  Filled 2014-06-20 (×13): qty 1

## 2014-06-20 MED ORDER — INSULIN ASPART 100 UNIT/ML ~~LOC~~ SOLN
0.0000 [IU] | Freq: Every day | SUBCUTANEOUS | Status: DC
Start: 2014-06-20 — End: 2014-06-23

## 2014-06-20 MED ORDER — VANCOMYCIN HCL IN DEXTROSE 1-5 GM/200ML-% IV SOLN
INTRAVENOUS | Status: AC
  Filled 2014-06-20: qty 200

## 2014-06-20 MED ORDER — COLLAGENASE 250 UNIT/GM EX OINT
TOPICAL_OINTMENT | Freq: Every day | CUTANEOUS | Status: DC
  Administered 2014-06-20 – 2014-06-23 (×4): via TOPICAL
  Filled 2014-06-20: qty 30

## 2014-06-20 MED ORDER — LACTULOSE ENEMA
300.0000 mL | Freq: Two times a day (BID) | ORAL | Status: DC
  Administered 2014-06-20 – 2014-06-21 (×4): 300 mL via RECTAL
  Filled 2014-06-20 (×9): qty 300

## 2014-06-20 MED ORDER — ENOXAPARIN SODIUM 30 MG/0.3ML ~~LOC~~ SOLN
30.0000 mg | SUBCUTANEOUS | Status: DC
  Administered 2014-06-21 – 2014-06-23 (×3): 30 mg via SUBCUTANEOUS
  Filled 2014-06-20 (×3): qty 0.3

## 2014-06-20 MED ORDER — OCUVITE-LUTEIN PO CAPS
1.0000 | ORAL_CAPSULE | Freq: Every day | ORAL | Status: DC
  Administered 2014-06-22 – 2014-06-23 (×2): 1 via ORAL
  Filled 2014-06-20 (×2): qty 1

## 2014-06-20 MED ORDER — POTASSIUM & SODIUM PHOSPHATES 280-160-250 MG PO PACK
1.0000 | PACK | Freq: Three times a day (TID) | ORAL | Status: DC
  Filled 2014-06-20 (×2): qty 1

## 2014-06-20 MED ORDER — MORPHINE SULFATE 2 MG/ML IJ SOLN
1.0000 mg | INTRAMUSCULAR | Status: DC | PRN
  Administered 2014-06-20 – 2014-06-21 (×4): 1 mg via INTRAVENOUS
  Filled 2014-06-20 (×4): qty 1
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
## 2014-06-20 NOTE — Progress Notes (Signed)
Patient is NPO for ultrasound at this time. To be done in about an hour per ultrasound. Nurse concerned patient is too drowsy to eat or take medication. Will reevaluate when patient returns from ultrasound
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
## 2014-06-20 NOTE — Clinical Social Work Psychosocial (Signed)
Clinical Social Work Department BRIEF PSYCHOSOCIAL ASSESSMENT 06/20/2014  Patient:  Tracey Heath,Tracey Heath     Account Number:  [id number]     Admit date:  06/19/2014  Clinical Social Worker:  Nancie NeasSTULTZ,Denali Becvar, LCSW  Date/Time:  06/20/2014 10:28 AM  Referred by:  CSW  Date Referred:  06/20/2014 Referred for  SNF Placement   Other Referral:   Interview type:  Family Other interview type:   Aneta MinsPhillip- son    PSYCHOSOCIAL DATA Living Status:  FACILITY [facility] Level of care:  Skilled Nursing Facility Primary support name:  Aneta Minshillip Primary support relationship to patient:  CHILD, ADULT Degree of support available:   supportive    CURRENT CONCERNS Current Concerns  Post-Acute Placement   Other Concerns:    SOCIAL WORK ASSESSMENT / PLAN CSW spoke with pt's son, Aneta Minshillip as she is oriented to self only. Pt known to CSW from admission two weeks ago. [facility]hill[facility] but pt has had a significant decline in the last week. They have noticed a decline for several months. Pt was made DNR by family this hospital admission. Aneta Minshillip shared that they are not ready for comfort care as they want to see how she does in hospital. CSW provided brief support. Pt has very supportive sons who visit regularly. [facility] and is agreeable to keeping facility updated on pt. [facility] had progressed to walking a few feet with moderate assist. They are agreeable to return pending bed availability and will start [employer] re-authorization when appropriate.   Assessment/plan status:  Psychosocial Support/Ongoing Assessment of Needs Other assessment/ plan:   Information[facility]ref[facility] resources[facility]   [facility]    PATIENT'S/FAMILY'S RESPONSE TO PLAN OF CARE: Pt unable to participate in plan of care at this time. [facility]. CSW will continue to follow.       Derenda FennelKara Holton Sidman,  [place] [phone]
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
## 2014-06-20 NOTE — Progress Notes (Signed)
Patient moved to low air loss mattress. Patient medicated with Morphine then dressing change done to left leg as ordered by wound nurse. Patient complains of pain in leg. Warming blanket off since going to US. Will continue to monitor temp.

## 2014-06-20 NOTE — Consult Note (Signed)
WOC wound consult note Reason for Consult: Necrotic area on left hip and early breakdown on sacrum Wound type:Unstageable PrU at head of trochanter on left lateral hip and area of denuded skin due to incontinence associated dermatitis at left of sacrum Pressure Ulcer POA: Yes Measurement: Left hip:  3.5cm x 2.5cm with depth not known due to the presence of black necrotic tissue.  Area is in a depression indicating that there may have been a previous injury in this location.  Sacral area is erythematous, but blanches, with two small denuded areas to the immediate let of the sacrum from IAD. These measure 0.4cm round and 0.2cm round with 0.2cm depth and moist, pink bases. Patient has recently been incontinent of stool during the time of my assessment. Wound bed:As described above. Drainage (amount, consistency, odor) None Periwound:blanching erythema at the sacral area. Dressing procedure/placement/frequency:I will implement a therapeutic mattress with low air loss feature to manage pressure and moisture and provide guidance to the staff regarding positioning off of the left side.  Heel pressure redistribution boots are provided as well as guidance for care of IAD using our house 3-step products. WOC nursing team will not follow, but will remain available to this patient, the nursing and medical team.  Please re-consult if needed. Thanks, Ladona MowLaurie Tyresse Jayson, MSN, RN, GNP, Galt[place]OCN, CWON-AP [phone])

## 2014-06-20 NOTE — Progress Notes (Signed)
Lab called to notify that anerobic fluid culture needs to be cancelled. They will handle.

## 2014-06-20 NOTE — Progress Notes (Signed)
Paracentesis complete no signs of distress. 1800 ml yellow colored ascites removed.  

## 2014-06-20 NOTE — Procedures (Signed)
PreOperative Dx: Cirrhosis, ascites Postoperative Dx: Cirrhosis, ascites Procedure:   US guided parK[place]acentesis Radiologist:  Tyron RussellBoles Anesthesia:  10 ml of 1% lidocaine Specimen:  1800 ml of clear yellow ascitic fluid EBL:   < 1 ml Complications: None

## 2014-06-20 NOTE — Progress Notes (Signed)
PT Cancellation Note  Patient Details Name: Tracey Heath MRN: [id number] DOB: 1930-03-15   Cancelled Treatment:    Reason Eval/Treat Not Completed: Medical issues which prohibited therapy.  Pt just admitted, moaning in the bed.  RN states that her temp is just now up to 98degrees.  She just received morphine for pain from left thigh wound.  She is unable to become alert enough for an evaluation.  Will try again tomorrow.   Myrlene BrokerBrown, Sumner Boesch L 06/20/2014, 11:34 AM

## 2014-06-20 NOTE — Progress Notes (Signed)
Patient admitted last night with possible sepsis ascites hyperammonemia altered mental status chronic aortic stenosis cultures performed currently on 3 antibiotics vancomycin and aztreonam and Levaquin. Patient is a no code has been dwindling for a period of 2-3 months currently in sinus rhythm patient given lactulose via enema awaiting results results of further tests Tracey CokeFrances M Heath [id number] DOB: 03/31/1930 DOA: 06/19/2014 PCP: Tracey StallingNDIEGO,Khayla Koppenhaver M, MD             Physical Exam: Blood pressure 111/48, pulse 89, temperature 98.4 F (36.9 C), temperature source Oral, resp. rate 18, height 5\' 5"  (1.651 m), weight 207 lb 3.7 oz (94 kg), SpO2 99 %. no JVD no carotid bruits no thyromegaly lungs diminished breath sounds in the bases no rales wheeze or rhonchi appreciable heart regular rhythm 3/6 aortic outflow murmur no S3 auscultated no heaves thrills rubs abdomen soft mild periumbilical hernia no guarding no rebound   Investigations:  Recent Results (from the past 240 hour(s))  Wound culture     Status: None   Collection Time: 06/15/14  2:00 PM  Result Value Ref Range Status   Specimen Description WOUND LEFT THIGH  Final   Special Requests NONE  Final   Gram Stain   Final    RARE WBC PRESENT, PREDOMINANTLY PMN NO SQUAMOUS EPITHELIAL CELLS SEEN NO ORGANISMS SEEN Performed at [facility]    Culture   Final    MULTIPLE ORGANISMS PRESENT, NONE PREDOMINANT Note: NO STAPHYLOCOCCUS AUREUS ISOLATED NO GROUP A STREP (S.PYOGENES) ISOLATED Performed at [facility]    Report Status 06/18/2014 FINAL  Final  Culture, Urine     Status: None   Collection Time: 06/15/14  8:00 PM  Result Value Ref Range Status   Specimen Description URINE, CLEAN CATCH  Final   Special Requests NONE  Final   Colony Count   Final    8,000 COLONIES/ML Performed at [facility]    Culture   Final    INSIGNIFICANT GROWTH Performed at [facility]    Report Status  06/17/2014 FINAL  Final  Blood Culture (routine x 2)     Status: None (Preliminary result)   Collection Time: 06/19/14 11:30 PM  Result Value Ref Range Status   Specimen Description LEFT ANTECUBITAL  Final   Special Requests BOTTLES DRAWN AEROBIC ONLY 6CC  Final   Culture PENDING  Incomplete   Report Status PENDING  Incomplete  Blood Culture (routine x 2)     Status: None (Preliminary result)   Collection Time: 06/19/14 11:45 PM  Result Value Ref Range Status   Specimen Description LEFT ANTECUBITAL  Final   Special Requests BOTTLES DRAWN AEROBIC ONLY [facility]6CC  Final   Culture PENDING  Incomplete   Report Status PENDING  Incomplete     Basic Metabolic Panel:  Recent Labs  81/19/1403/19/16 0400 06/19/14 2254  NA 138 139  K 4.5 4.9  CL 110 105  CO2 21 21  GLUCOSE 140* 178*  BUN 46* 51*  CREATININE 1.69* 1.74*  CALCIUM 8.7 9.0   Liver Function Tests:  Recent Labs  06/19/14 2254  AST 35  ALT 18  ALKPHOS 60  BILITOT 2.6*  PROT 7.0  ALBUMIN 2.6*     CBC:  Recent Labs  06/18/14 0400 06/19/14 2254  WBC 3.3* 6.6  NEUTROABS 2.1 5.5  HGB 8.8* 10.6*  HCT 26.5* 31.2*  MCV 95.3 94.8  PLT 72* 115*    Dg Chest 2 View  06/19/2014   CLINICAL DATA:  Pneumonia.  Subsequent counter  EXAM: CHEST  2 VIEW  COMPARISON:  Radiograph 06/06/2014  FINDINGS: Stable enlarged cardiac silhouette. There is fine interstitial pattern. Low lung volumes. There is thickening along the right lateral chest wall similar prior. This corresponds to rib fractures on comparison CT of 12/13/2011  IMPRESSION: Low lung volumes and mild interstitial lung disease. No evidence of pneumonia.   Electronically Signed   By: Genevive Bi M.D.   On: 06/19/2014 17:46   Ct Head Wo Contrast  06/20/2014   CLINICAL DATA:  Altered mental status, worsening over 2 days. Nausea and vomiting.  EXAM: CT HEAD WITHOUT CONTRAST  TECHNIQUE: Contiguous axial images were obtained from the base of the skull through the vertex without  intravenous contrast.  COMPARISON:  06/06/2014  FINDINGS: There is no intracranial hemorrhage, mass or evidence of acute infarction. There is mild generalized atrophy. There is mild chronic microvascular ischemic change. There is no significant extra-axial fluid collection.  No acute intracranial findings are evident.  IMPRESSION: Mild atrophy and chronic microvascular ischemic changes. No acute intracranial abnormality.   Electronically Signed   By: Ellery Plunk M.D.   On: 06/20/2014 00:16   Ct Renal Stone Study  06/20/2014   CLINICAL DATA:  Nausea and vomiting for 2 days. Altered mental status.  EXAM: CT ABDOMEN AND PELVIS WITHOUT CONTRAST  TECHNIQUE: Multidetector CT imaging of the abdomen and pelvis was performed following the standard protocol without IV contrast.  COMPARISON:  12/13/2011  FINDINGS: There are cirrhotic appearing morphologic irregularities of the liver. There are numerous upper abdominal varices although (seen to better advantage on the contrast-enhanced study of 12/13/2011). There is large volume peritoneal ascites. The ascites is new from 12/13/2011. Spleen is borderline in size. There is extensive pneumobilia.  There are unremarkable unenhanced appearances of the adrenals and kidneys. The pancreas appears somewhat atrophic but no pancreatic mass is evident. Bowel is grossly unremarkable, with no evidence of obstruction and no extraluminal air. No focal inflammatory changes are evident in the abdomen or pelvis.  There is a small umbilical hernia containing ascitic fluid and a little unobstructed small bowel. There are severe degenerative disc changes in the lumbar spine. There is chronic compression of the L3 vertebral body which is unchanged. No significant skeletal lesions are evident.  IMPRESSION: 1. Cirrhosis with large volume ascites and large varices. 2. No evidence of bowel obstruction, perforation or focal inflammatory change 3. Pneumobilia.  This is often associated with prior  sphincterotomy.   Electronically Signed   By: Ellery Plunk M.D.   On: 06/20/2014 00:32      Medications:   Impression: Hyperammonemia \\Ascites  cirrhosis per CT scan  Principal Problem:   Altered mental status Active Problems:   Diabetes mellitus, type II   Atrial fibrillation   Chronic anticoagulation   Anemia   Generalized weakness   Thrombocytopenia   Elevated bilirubin   Edema   Sepsis   Hypothermia   Hyperammonemia   Cirrhosis   CKD (chronic kidney disease), stage III     Plan: GI consultation requested for pneumobilia hyperammonemia currently on lactulose rectally. Continue triple antibiotics awaiting cultures nothing revealing at present. Patient is a no code no intubation   Consultants: Gastroenterology requested    Procedures   Antibiotics: Vancomycin aztreonam and Levaquin                  Code Status: No code no intubation   Family Communication:    Disposition Plan await results of  cultures patient is a no CODE BLUE she is in terminal stages prognosis abysmal disease port of care with triple antibiotics lactulose request GI consultation and comfort measures  Time spent: 30 minutes   LOS: 0 days   Lonni Dirden M   06/20/2014, 7:14 AM

## 2014-06-20 NOTE — Progress Notes (Signed)
Patient unable to wake up to take medications or eat. Dr. Janna Archondiego notified. Per physician patient is no code/DNR and he is aware of her status.

## 2014-06-20 NOTE — Progress Notes (Signed)
Patient having a large amount of weeping and oozing from paracentesis site. Dressing changed twice since patient has returned from paracentesis. On call physician Dr. Ouida SillsFagan called to notify, but no new orders received. Continue to change to bulky dressing per physician.

## 2014-06-20 NOTE — Progress Notes (Signed)
Patient had large stool at this time. Sacrum cleansed as ordered by wound nurse and barrier cream applied.

## 2014-06-21 LAB — URINE CULTURE
COLONY COUNT: NO GROWTH
Culture: NO GROWTH

## 2014-06-21 LAB — CBC WITH DIFFERENTIAL/PLATELET
Basophils Absolute: 0 10*3/uL (ref 0.0–0.1)
Basophils Relative: 0 % (ref 0–1)
Eosinophils Absolute: 0.1 10*3/uL (ref 0.0–0.7)
Eosinophils Relative: 3 % (ref 0–5)
HCT: 25.6 % — ABNORMAL LOW (ref 36.0–46.0)
Hemoglobin: 8.5 g/dL — ABNORMAL LOW (ref 12.0–15.0)
LYMPHS ABS: 0.7 10*3/uL (ref 0.7–4.0)
Lymphocytes Relative: 21 % (ref 12–46)
MCH: 31.7 pg (ref 26.0–34.0)
MCHC: 33.2 g/dL (ref 30.0–36.0)
MCV: 95.5 fL (ref 78.0–100.0)
Monocytes Absolute: 0.5 10*3/uL (ref 0.1–1.0)
Monocytes Relative: 14 % — ABNORMAL HIGH (ref 3–12)
NEUTROS ABS: 2.2 10*3/uL (ref 1.7–7.7)
Neutrophils Relative %: 62 % (ref 43–77)
PLATELETS: 72 10*3/uL — AB (ref 150–400)
RBC: 2.68 MIL/uL — ABNORMAL LOW (ref 3.87–5.11)
RDW: 18.4 % — ABNORMAL HIGH (ref 11.5–15.5)
Smear Review: DECREASED
WBC: 3.5 10*3/uL — ABNORMAL LOW (ref 4.0–10.5)

## 2014-06-21 LAB — GLUCOSE, CAPILLARY
GLUCOSE-CAPILLARY: 118 mg/dL — AB (ref 70–99)
Glucose-Capillary: 142 mg/dL — ABNORMAL HIGH (ref 70–99)
Glucose-Capillary: 118 mg/dL — ABNORMAL HIGH (ref 70–99)
Glucose-Capillary: 136 mg/dL — ABNORMAL HIGH (ref 70–99)

## 2014-06-21 LAB — BASIC METABOLIC PANEL
Anion gap: 8 (ref 5–15)
BUN: 46 mg/dL — ABNORMAL HIGH (ref 6–23)
CALCIUM: 8.2 mg/dL — AB (ref 8.4–10.5)
CO2: 24 mmol/L (ref 19–32)
Chloride: 110 mmol/L (ref 96–112)
Creatinine, Ser: 1.73 mg/dL — ABNORMAL HIGH (ref 0.50–1.10)
GFR calc Af Amer: 30 mL/min — ABNORMAL LOW (ref >90)
GFR calc non Af Amer: 26 mL/min — ABNORMAL LOW (ref >90)
GLUCOSE: 128 mg/dL — AB (ref 70–99)
Potassium: 3.8 mmol/L (ref 3.5–5.1)
Sodium: 142 mmol/L (ref 135–145)

## 2014-06-21 LAB — AMMONIA: Ammonia: 22 umol/L (ref 11–32)

## 2014-06-21 NOTE — Progress Notes (Signed)
Patient much more alert and able to stay awake this evening.  Eating her dinner and able to take PO meds well with assistance.

## 2014-06-21 NOTE — Clinical Social Work Note (Signed)
CSW spoke with pt's son, Tracey Heath after discussion with MD. [facility] when [facility] plan to make decision. If decide on comfort care, family would prefer to take pt home with hospice. CM notified. [facility] also updated with son's permission. Support provided. Will follow up tomorrow.  Derenda FennelKara Laronica Bhagat, [place] [phone]

## 2014-06-21 NOTE — Progress Notes (Signed)
Patient more alert this morning and speaking some.  Patient stated she was thirsty, attempted to give her water but appeared to get strangled on it.  Patient would not keep eyes open when trying to drink and would quickly go back to sleep.  Did not give any PO meds this morning, will reattempt if patient becomes more alert.  Question is speech//swallow eval would be appropriate.  Will notify MD on his rounds this afternoon.

## 2014-06-21 NOTE — Progress Notes (Signed)
Long discussion with one side of family about options for comfort care and palliative care including hospice, hospice at home skilled nursing facility home health care in hospital care. They're leaning towards hospice at home currently on 3 antibiotics paracentesis performed no focus of infection found at present ammonia level XXII continue vancomycin aztreonam and Levaquin Inda CokeFrances M Leiphart WJX:[id number] DOB: 08-Oct-1929 DOA: 06/19/2014 PCP: Isabella StallingNDIEGO,Jaysiah Marchetta M, MD             Physical Exam: Blood pressure 144/60, pulse 85, temperature 98.4 F (36.9 C), temperature source Axillary, resp. rate 19, height 5\' 5"  (1.651 m), weight 199 lb 4.7 oz (90.4 kg), SpO2 96 %. lungs clear to A&P diminished breath sounds in bases no rales wheeze rhonchi appreciable heart regular rhythm no murmurs gallops heart regular rhythm 3/6 aortic pulmonary no S3-S4 measles rubs abdomen soft nontender bowel sounds normoactive no guarding or rebound no masses no megaly   Investigations:  Recent Results (from the past 240 hour(s))  Wound culture     Status: None   Collection Time: 06/15/14  2:00 PM  Result Value Ref Range Status   Specimen Description WOUND LEFT THIGH  Final   Special Requests NONE  Final   Gram Stain   Final    RARE WBC PRESENT, PREDOMINANTLY PMN NO SQUAMOUS EPITHELIAL CELLS SEEN NO ORGANISMS SEEN Performed at [facility]    Culture   Final    MULTIPLE ORGANISMS PRESENT, NONE PREDOMINANT Note: NO STAPHYLOCOCCUS AUREUS ISOLATED NO GROUP A STREP (S.PYOGENES) ISOLATED Performed at [facility]    Report Status 06/18/2014 FINAL  Final  Culture, Urine     Status: None   Collection Time: 06/15/14  8:00 PM  Result Value Ref Range Status   Specimen Description URINE, CLEAN CATCH  Final   Special Requests NONE  Final   Colony Count   Final    8,000 COLONIES/ML Performed at [facility]    Culture   Final    INSIGNIFICANT GROWTH Performed at [facility]     Report Status 06/17/2014 FINAL  Final  Blood Culture (routine x 2)     Status: None (Preliminary result)   Collection Time: 06/19/14 11:30 PM  Result Value Ref Range Status   Specimen Description BLOOD LEFT ANTECUBITAL  Final   Special Requests BOTTLES DRAWN AEROBIC ONLY 6CC  Final   Culture NO GROWTH 1 DAY  Final   Report Status PENDING  Incomplete  Blood Culture (routine x 2)     Status: None (Preliminary result)   Collection Time: 06/19/14 11:45 PM  Result Value Ref Range Status   Specimen Description BLOOD LEFT ANTECUBITAL  Final   Special Requests BOTTLES DRAWN AEROBIC ONLY 6CC  Final   Culture NO GROWTH 1 DAY  Final   Report Status PENDING  Incomplete  MRSA PCR Screening     Status: None   Collection Time: 06/20/14  2:45 AM  Result Value Ref Range Status   MRSA by PCR NEGATIVE NEGATIVE Final    Comment:        The GeneXpert MRSA Assay (FDA approved for NASAL specimens only), is one component of a comprehensive MRSA colonization surveillance program. It is not intended to diagnose MRSA infection nor to guide or monitor treatment for MRSA infections.   Body fluid culture     Status: None (Preliminary result)   Collection Time: 06/20/14  3:20 PM  Result Value Ref Range Status   Specimen Description ASCITIC FLUID  Final  Special Requests NONE  Final   Gram Stain   Final    NO WBC SEEN NO ORGANISMS SEEN Performed at [facility]    Culture NO GROWTH Performed at [facility]   Final   Report Status PENDING  Incomplete     Basic Metabolic Panel:  Recent Labs  40/98/11 0640 06/21/14 0524  NA 139 142  K 4.1 3.8  CL 109 110  CO2 21 24  GLUCOSE 171* 128*  BUN 50* 46*  CREATININE 1.72* 1.73*  CALCIUM 8.3* 8.2*   Liver Function Tests:  Recent Labs  06/19/14 2254 06/20/14 0640  AST 35 27  ALT 18 15  ALKPHOS 60 45  BILITOT 2.6* 1.9*  1.8*  PROT 7.0 5.3*  ALBUMIN 2.6* 1.9*     CBC:  Recent Labs  06/19/14 2254 06/20/14 0640  06/21/14 0524  WBC 6.6 5.1 3.5*  NEUTROABS 5.5  --  2.2  HGB 10.6* 8.2* 8.5*  HCT 31.2* 24.5* 25.6*  MCV 94.8 93.5 95.5  PLT 115* 73* 72*    Dg Chest 2 View  06/19/2014   CLINICAL DATA:  Pneumonia.  Subsequent counter  EXAM: CHEST  2 VIEW  COMPARISON:  Radiograph 06/06/2014  FINDINGS: Stable enlarged cardiac silhouette. There is fine interstitial pattern. Low lung volumes. There is thickening along the right lateral chest wall similar prior. This corresponds to rib fractures on comparison CT of 12/13/2011  IMPRESSION: Low lung volumes and mild interstitial lung disease. No evidence of pneumonia.   Electronically Signed   By: Genevive Bi M.D.   On: 06/19/2014 17:46   Ct Head Wo Contrast  06/20/2014   CLINICAL DATA:  Altered mental status, worsening over 2 days. Nausea and vomiting.  EXAM: CT HEAD WITHOUT CONTRAST  TECHNIQUE: Contiguous axial images were obtained from the base of the skull through the vertex without intravenous contrast.  COMPARISON:  06/06/2014  FINDINGS: There is no intracranial hemorrhage, mass or evidence of acute infarction. There is mild generalized atrophy. There is mild chronic microvascular ischemic change. There is no significant extra-axial fluid collection.  No acute intracranial findings are evident.  IMPRESSION: Mild atrophy and chronic microvascular ischemic changes. No acute intracranial abnormality.   Electronically Signed   By: Ellery Plunk M.D.   On: 06/20/2014 00:16   US Abdomen Complete  06/20/2014   CLINICAL DATA:  Cirrhosis, ascites  EXAM: ULTRASOUND ABDOMEN COMPLETE  COMPARISON:  CT 06/19/2014  FINDINGS: Gallbladder: Gallbladder is contracted. Shadowing noted in the gallbladder fossa, possibly shadowing gallstone. Negative sonographic Murphy's.  Common bile duct: Diameter: Normal caliber, 2 mm.  Liver: Nodular, shrunken liver compatible with cirrhosis. No focal abnormality. Large volume perihepatic ascites.  IVC: No abnormality visualized.  Pancreas:  Pancreatic body and tail cannot be visualized due to overlying bowel gas.  Spleen: Size and appearance within normal limits.  Right Kidney: Length: 9.1 cm. Echogenicity within normal limits. No mass or hydronephrosis visualized.  Left Kidney: Length: 9.9 cm. Echogenicity within normal limits. No mass or hydronephrosis visualized.  Abdominal aorta: No aneurysm visualized.  Other findings: Moderate to large volume perihepatic and perisplenic ascites.  IMPRESSION: Changes of cirrhosis with associated ascites.  Contracted gallbladder with possible shadowing gallstone.   Electronically Signed   By: Charlett Nose M.D.   On: 06/20/2014 11:08   US Paracentesis  06/20/2014   CLINICAL DATA:  Ascites, diabetes, cirrhotic liver by ultrasound and CT exams  EXAM: ULTRASOUND GUIDED DIAGNOSTIC AND THERAPEUTIC PARACENTESIS  COMPARISON:  None.  PROCEDURE: Procedure, benefits, and risks of procedure were discussed by the floor with the patient's son.  Informed telephone consent for procedure was obtained from the patient's son.  Time out protocol followed.  Adequate collection of ascites localized by ultrasound in RIGHT lower quadrant.  Skin prepped and draped in usual sterile fashion.  Skin and soft tissues anesthetized with 10 mL of 1% lidocaine.  5 [place] Yueh catheter placed into peritoneal cavity.  1800 mL of clear yellow fluid aspirated by vacuum bottle suction.  Procedure tolerated well by patient without immediate complication.  FINDINGS: A total of approximately 1800 mL of ascitic fluid was removed. A fluid sample of 180 mL was sent for laboratory analysis.  IMPRESSION: Successful ultrasound guided paracentesis yielding 1800 mL of ascites.   Electronically Signed   By: Ulyses Southward M.D.   On: 06/20/2014 16:03   Ct Renal Stone Study  06/20/2014   CLINICAL DATA:  Nausea and vomiting for 2 days. Altered mental status.  EXAM: CT ABDOMEN AND PELVIS WITHOUT CONTRAST  TECHNIQUE: Multidetector CT imaging of the abdomen and pelvis  was performed following the standard protocol without IV contrast.  COMPARISON:  12/13/2011  FINDINGS: There are cirrhotic appearing morphologic irregularities of the liver. There are numerous upper abdominal varices although (seen to better advantage on the contrast-enhanced study of 12/13/2011). There is large volume peritoneal ascites. The ascites is new from 12/13/2011. Spleen is borderline in size. There is extensive pneumobilia.  There are unremarkable unenhanced appearances of the adrenals and kidneys. The pancreas appears somewhat atrophic but no pancreatic mass is evident. Bowel is grossly unremarkable, with no evidence of obstruction and no extraluminal air. No focal inflammatory changes are evident in the abdomen or pelvis.  There is a small umbilical hernia containing ascitic fluid and a little unobstructed small bowel. There are severe degenerative disc changes in the lumbar spine. There is chronic compression of the L3 vertebral body which is unchanged. No significant skeletal lesions are evident.  IMPRESSION: 1. Cirrhosis with large volume ascites and large varices. 2. No evidence of bowel obstruction, perforation or focal inflammatory change 3. Pneumobilia.  This is often associated with prior sphincterotomy.   Electronically Signed   By: Ellery Plunk M.D.   On: 06/20/2014 00:32      Medications:   Impression: The dwindles Chronic cirrhosis with ascites Possible sepsis no focus found  Principal Problem:   Altered mental status Active Problems:   Diabetes mellitus, type II   Atrial fibrillation   Chronic anticoagulation   Anemia   Generalized weakness   Thrombocytopenia   Elevated bilirubin   Edema   Sepsis   Hypothermia   Hyperammonemia   Cirrhosis   CKD (chronic kidney disease), stage III   Ascites   Hepatic encephalopathy     Plan: After family discussion they will talk with each other will possibly: Hospice tomorrow. Continue comfort care triple antibiotics  and fluids monitor electrolytes what mouth  Consultants: Gastroenterology   Procedures   Antibiotics:                   Code Status: No code no intubation   Family Communication:  Long talk with 3 family members including 1 son and 2 grandchildren  Disposition Plan will consider hospice tomorrow for home hospice care rest of family agrees  Time spent: 40 minutes   LOS: 1 day   Inanna Telford M   06/21/2014, 12:37 PM

## 2014-06-21 NOTE — Plan of Care (Signed)
Problem: Phase I Progression Outcomes Goal: Voiding-avoid urinary catheter unless indicated Outcome: Not Progressing Foley in place for IV diuresis

## 2014-06-21 NOTE — Care Management Utilization Note (Signed)
UR completed 

## 2014-06-21 NOTE — Evaluation (Signed)
Physical Therapy Evaluation Patient Details Name: Tracey Heath MRN: [id number] DOB: November 15, 1929 Today's Date: 06/21/2014   History of Present Illness  Tracey Heath is a 81 y.o. Caucasian female with history of type 2 diabetes, paroxysmal atrial fibrillation, glaucoma, aortic valve disease, recent history of community-acquired pneumonia, frequent falls, and generalized weakness who presents with the above complaints. Patient is not able to provide any history at this time due to altered mental status. Most of the history was obtained from sons at bedside. Family reported that since her previous hospitalization, she has been confused at baseline, however her confusion has progressed in the last 2-3 days. She has also been weak and lethargic. And had one episode of vomiting after eating dinner today. She was brought to the emergency department for further evaluation. In the emergency department she was hypothermic, there was concern for sepsis and she was started on empiric antibiotics. Hospitalist service was asked with the patient for further care and management.  Clinical Impression  Pt is very lethargic but able to awaken and cooperate to a minimal degree with therapeutic exercise in the bed.  She is here from the [facility] being seen for rehab of generalized weakness.  Pt is much weaker now than that found during the last hospitalization.  She was oriented to self and place and has a wound on her left thigh which causes her intermittent pain.  Both feet are in prevlon boots.  Following exercise she was too fatigued to work on bed mobility. Her generalized strength in in the 2/5 range and head control is poor.  She is able to bring her hand to her mouth but is unable to feed herself.  She was able to follow all directions and was cooperative.  I would recommend transfer to SNF at d/c unless Hospice is pursued.    Follow Up Recommendations SNF    Equipment Recommendations  None recommended by  PT    Recommendations for Other Services   OT    Precautions / Restrictions Precautions Precautions: Fall Restrictions Weight Bearing Restrictions: No      Mobility  Bed Mobility               General bed mobility comments: too fatigued to even try to mobilize in the bed  Transfers Overall transfer level:  (unable to test)                  Ambulation/Gait Ambulation/Gait assistance:  (unable to test)              Stairs            Wheelchair Mobility    Modified Rankin (Stroke Patients Only)       Balance                                             Pertinent Vitals/Pain Pain Assessment: No/denies pain    Home Living Family/patient expects to be discharged to:: Skilled nursing facility                      Prior Function Level of Independence: Needs assistance   Gait / Transfers Assistance Needed: pt has been at SNF since last hospitalization and per report in the chart she was able to take a few steps with a walker  ADL's / Homemaking Assistance Needed: since SNF admission  pt has needed total care with all ADLs        Hand Dominance   Dominant Hand: Right    Extremity/Trunk Assessment   Upper Extremity Assessment: Generalized weakness           Lower Extremity Assessment: Generalized weakness         Communication   Communication: No difficulties  Cognition Arousal/Alertness: Lethargic Behavior During Therapy: Flat affect Overall Cognitive Status: Within Functional Limits for tasks assessed                      General Comments      Exercises General Exercises - Lower Extremity Ankle Circles/Pumps: AAROM;Both;5 reps;Supine Short Arc Quad: AAROM;Both;10 reps;Supine Heel Slides: AAROM;Both;5 reps;Supine Hip ABduction/ADduction: AAROM;Both;5 reps;Supine      Assessment/Plan    PT Assessment Patient needs continued PT services  PT Diagnosis Generalized weakness   PT  Problem List Decreased strength;Decreased activity tolerance;Decreased mobility;Obesity;Decreased skin integrity  PT Treatment Interventions     PT Goals (Current goals can be found in the Care Plan section) Acute Rehab PT Goals Patient Stated Goal: none stated PT Goal Formulation: With family Time For Goal Achievement: 07/05/14 Potential to Achieve Goals: Fair    Frequency Min 2X/week   Barriers to discharge Decreased caregiver support      Co-evaluation               End of Session   Activity Tolerance: Patient limited by fatigue Patient left: in bed;with call bell/phone within reach;with bed alarm set;with family/visitor present           Time: 0830-0902 PT Time Calculation (min) (ACUTE ONLY): 32 min   Charges:   PT Evaluation $Initial PT Evaluation Tier I: 1 Procedure     PT G CodesMyrlene Broker:        Slutsky, Doni Bacha L 06/21/2014, 9:25 AM

## 2014-06-21 NOTE — Progress Notes (Addendum)
Subjective: More alert today. Son at bedside. No overt GI bleeding. Knows [place] is in [place] in the hospital. Unknown year. Denies abdominal pain, N/V. 3 stools in last 24 hours. On lactulose enemas.   Objective: Vital signs in last 24 hours: Temp:  [97.7 F (36.5 C)-98.5 F (36.9 C)] 98.4 F (36.9 C) (03/22 0400) Pulse Rate:  [75-90] 85 (03/22 0400) Resp:  [18-20] 19 (03/22 0400) BP: (110-144)/(43-60) 144/60 mmHg (03/22 0400) SpO2:  [96 %-100 %] 96 % (03/22 0400) Weight:  [199 lb 4.7 oz (90.4 kg)] 199 lb 4.7 oz (90.4 kg) (03/22 0500) Last BM Date: 06/20/14 General:   Eyes closed sleeping but arouses easily to verbal stimuli. More alert and conversant today than yesterday.  Head:  Normocephalic and atraumatic. Abdomen:  Bowel sounds present, soft, non-tender, non-distended. Umbilical hernia noted.  Extremities:  2+ lower extremity edema, much improved from yesterday.  Neurologic:  Oriented to person and place only    Intake/Output from previous day: 03/21 0701 - 03/22 0700 In: 400 [IV Piggyback:400] Out: 1450 [Urine:1450] Intake/Output this shift:    Lab Results:  Recent Labs  06/19/14 2254 06/20/14 0640 06/21/14 0524  WBC 6.6 5.1 3.5*  HGB 10.6* 8.2* 8.5*  HCT 31.2* 24.5* 25.6*  PLT 115* 73* 72*   BMET  Recent Labs  06/19/14 2254 06/20/14 0640 06/21/14 0524  NA 139 139 142  K 4.9 4.1 3.8  CL 105 109 110  CO2 21 21 24   GLUCOSE 178* 171* 128*  BUN 51* 50* 46*  CREATININE 1.74* 1.72* 1.73*  CALCIUM 9.0 8.3* 8.2*   LFT  Recent Labs  06/19/14 2254 06/20/14 0640  PROT 7.0 5.3*  ALBUMIN 2.6* 1.9*  AST 35 27  ALT 18 15  ALKPHOS 60 45  BILITOT 2.6* 1.9*  1.8*  BILIDIR  --  0.4  IBILI  --  1.5*   PT/INR  Recent Labs  06/20/14 0640  LABPROT 21.6*  INR 1.86*   Hepatitis Panel  Recent Labs  06/20/14 0640  HEPBSAG NEGATIVE  HCVAB NEGATIVE  HEPAIGM NON REACTIVE  HEPBIGM NON REACTIVE    Studies/Results: Dg Chest 2 View  06/19/2014    CLINICAL DATA:  Pneumonia.  Subsequent counter  EXAM: CHEST  2 VIEW  COMPARISON:  Radiograph 06/06/2014  FINDINGS: Stable enlarged cardiac silhouette. There is fine interstitial pattern. Low lung volumes. There is thickening along the right lateral chest wall similar prior. This corresponds to rib fractures on comparison CT of 12/13/2011  IMPRESSION: Low lung volumes and mild interstitial lung disease. No evidence of pneumonia.   Electronically Signed   By: Genevive BiStewart  Edmunds M.D.   On: 06/19/2014 17:46   Ct Head Wo Contrast  06/20/2014   CLINICAL DATA:  Altered mental status, worsening over 2 days. Nausea and vomiting.  EXAM: CT HEAD WITHOUT CONTRAST  TECHNIQUE: Contiguous axial images were obtained from the base of the skull through the vertex without intravenous contrast.  COMPARISON:  06/06/2014  FINDINGS: There is no intracranial hemorrhage, mass or evidence of acute infarction. There is mild generalized atrophy. There is mild chronic microvascular ischemic change. There is no significant extra-axial fluid collection.  No acute intracranial findings are evident.  IMPRESSION: Mild atrophy and chronic microvascular ischemic changes. No acute intracranial abnormality.   Electronically Signed   By: Ellery Plunkaniel R Mitchell M.D.   On: 06/20/2014 00:16   [place] Abdomen Complete  06/20/2014   CLINICAL DATA:  Cirrhosis, ascites  EXAM: ULTRASOUND ABDOMEN COMPLETE  COMPARISON:  CT 06/19/2014  FINDINGS: Gallbladder: Gallbladder is contracted. Shadowing noted in the gallbladder fossa, possibly shadowing gallstone. Negative sonographic Murphy's.  Common bile duct: Diameter: Normal caliber, 2 mm.  Liver: Nodular, shrunken liver compatible with cirrhosis. No focal abnormality. Large volume perihepatic ascites.  IVC: No abnormality visualized.  Pancreas: Pancreatic body and tail cannot be visualized due to overlying bowel gas.  Spleen: Size and appearance within normal limits.  Right Kidney: Length: 9.1 cm. Echogenicity within  normal limits. No mass or hydronephrosis visualized.  Left Kidney: Length: 9.9 cm. Echogenicity within normal limits. No mass or hydronephrosis visualized.  Abdominal aorta: No aneurysm visualized.  Other findings: Moderate to large volume perihepatic and perisplenic ascites.  IMPRESSION: Changes of cirrhosis with associated ascites.  Contracted gallbladder with possible shadowing gallstone.   Electronically Signed   By: Charlett Nose M.D.   On: 06/20/2014 11:08   US Paracentesis  06/20/2014   CLINICAL DATA:  Ascites, diabetes, cirrhotic liver by ultrasound and CT exams  EXAM: ULTRASOUND GUIDED DIAGNOSTIC AND THERAPEUTIC PARACENTESIS  COMPARISON:  None.  PROCEDURE: Procedure, benefits, and risks of procedure were discussed by the floor with the patient's son.  Informed telephone consent for procedure was obtained from the patient's son.  Time out protocol followed.  Adequate collection of ascites localized by ultrasound in RIGHT lower quadrant.  Skin prepped and draped in usual sterile fashion.  Skin and soft tissues anesthetized with 10 mL of 1% lidocaine.  5 [place] Yueh catheter placed into peritoneal cavity.  1800 mL of clear yellow fluid aspirated by vacuum bottle suction.  Procedure tolerated well by patient without immediate complication.  FINDINGS: A total of approximately 1800 mL of ascitic fluid was removed. A fluid sample of 180 mL was sent for laboratory analysis.  IMPRESSION: Successful ultrasound guided paracentesis yielding 1800 mL of ascites.   Electronically Signed   By: Ulyses Southward M.D.   On: 06/20/2014 16:03   Ct Renal Stone Study  06/20/2014   CLINICAL DATA:  Nausea and vomiting for 2 days. Altered mental status.  EXAM: CT ABDOMEN AND PELVIS WITHOUT CONTRAST  TECHNIQUE: Multidetector CT imaging of the abdomen and pelvis was performed following the standard protocol without IV contrast.  COMPARISON:  12/13/2011  FINDINGS: There are cirrhotic appearing morphologic irregularities of the liver.  There are numerous upper abdominal varices although (seen to better advantage on the contrast-enhanced study of 12/13/2011). There is large volume peritoneal ascites. The ascites is new from 12/13/2011. Spleen is borderline in size. There is extensive pneumobilia.  There are unremarkable unenhanced appearances of the adrenals and kidneys. The pancreas appears somewhat atrophic but no pancreatic mass is evident. Bowel is grossly unremarkable, with no evidence of obstruction and no extraluminal air. No focal inflammatory changes are evident in the abdomen or pelvis.  There is a small umbilical hernia containing ascitic fluid and a little unobstructed small bowel. There are severe degenerative disc changes in the lumbar spine. There is chronic compression of the L3 vertebral body which is unchanged. No significant skeletal lesions are evident.  IMPRESSION: 1. Cirrhosis with large volume ascites and large varices. 2. No evidence of bowel obstruction, perforation or focal inflammatory change 3. Pneumobilia.  This is often associated with prior sphincterotomy.   Electronically Signed   By: Ellery Plunk M.D.   On: 06/20/2014 00:32    Assessment: 81 year old female with multiple comorbidities, admitted with sepsis, altered mental status, decompensated cirrhosis (MELD 21), found to have mildly elevated ammonia at 87, mildly elevated  bilirubin with indirect component but normal transaminases. Elevated bilirubin likely secondary to Gilbert's. Paracentesis with 1.8 liters removed but no evidence of SBP Blood cultures without growth for 1 day. Cirrhosis etiology unknown but likely secondary to underlying NASH; viral markers currently pending. With ascites noted on imaging, recommend paracentesis with fluid analysis to assess for SBP, cytology still pending. Cirrhosis likely secondary to NASH. Would hold on extensive work-up for cirrhosis unless bump in LFTs.   If patient were to significantly improve clinically, could  consider EGD for assessment of known varices. Hepatic encephalopathy likely multifactorial in setting of sepsis. Ammonia level now normalized, and she is notably improved from yesterday; once she is more alert, would transition medications to oral. For now, keep lactulose enemas at twice a day, with hopeful transition to oral lactulose later today or tomorrow.   Anemia: with normal iron, ferritin. Likely chronic disease. Heme positive stool but NO evidence of overt GI bleeding. Continue to monitor.   Plan: Continue lactulose enemas with goal of at 3 soft BMsdaily. Will hopefully transition to oral once she is taking medication by mouth. Follow-up on cytology from fluid analyses DNR Empiric antibiotics started as per pharmacy Could consider EGD for variceal assessment once clinically improved    Nira Retort, ANP-BC [facility]    LOS: 1 day    06/21/2014, 8:05 AM  Attending note:  Patient seen and examined this afternoon. Discussed liver issues with multiple family members. Overall guarded prognosis reviewed. No rush to do an EGD at this point in time.

## 2014-06-21 NOTE — Care Management Note (Addendum)
    Page 1 of 1   06/23/2014     9:43:35 AM CARE MANAGEMENT NOTE 06/23/2014  Patient:  Tracey Heath,[name]   Account Number:  [id number]  Date Initiated:  06/21/2014  Documentation initiated by:  Kathyrn SheriffHILDRESS,JESSICA  Subjective/Objective Assessment:   [facility]. Pt admitted with AMS. MD discussing options with adult children, including comfort care. CSW is aware of facility placement and also following pt.  Discharge plan pending, will cont to follow.     Action/Plan:   Anticipated DC Date:  06/24/2014   Anticipated DC Plan:  SKILLED NURSING FACILITY  In-house referral  Clinical Social Worker      DC Planning Services  CM consult      PAC Choice  HOSPICE   Choice offered to / List presented to:  C-1 Patient           Status of service:  Completed, signed off Medicare Important Message given?  YES (If response is "NO", the following Medicare IM given date fields will be blank) Date Medicare IM given:  06/23/2014 Medicare IM given by:  Kathyrn SheriffHILDRESS,JESSICA Date Additional Medicare IM given:   Additional Medicare IM given by:    Discharge Disposition:  HOME W HOSPICE CARE  Per UR Regulation:  Reviewed for med. necessity/level of care/duration of stay  If discussed at Long Length of Stay Meetings, dates discussed:    Comments:  06/23/2014 0900 Kathyrn SheriffJessica childress, RN, MSN, CM Pt to discharge home with hospice services today. Granddaughter at bedside. Discharge summary faxed to Hospice. Pt will be transported home via EMS after all DME's are delivered to pt's home. 06/22/2014 1520 Kathyrn SheriffJessica Childress, RN, MSN, CM Palliate consult made but MD unable to see pt/family today. CM dscussed plan of care with pt and pt's sons (Tracey Heath and Tracey Heath). Pt states she wants to go home with hospice services. Family is aware this means they will have to provide total ADL care for patient. Family is agreable to this. Discussed plan with Dr. Janna Archondiego. Tracey Heath, per pt's  choice, notified and faxed pt information. Plan for discharge on 06/23/2014. CSW made aware of change in discharge plan and will notify [facility] SNF.  06/21/2014 1100 Kathyrn SheriffJessica Childress, RN, MSN, CM

## 2014-06-21 NOTE — Clinical Documentation Improvement (Signed)
Presents with possible sepsis, unstageable pressure ulcer to left hip, encephalopathy. Patient with abnormal lab value.   CBC with leukopenia (3.50 ), anemia (8.5 / 25.6 ) and thrombocytopenia (72)  Please provide a diagnosis associated with the above clinical indicators and document findings in next progress note and include in discharge summary if applicable.  Thank You, Shellee MiloEileen T Amanda Pote ,RN Clinical Documentation Specialist:  [phone]  [facility]- Health Information Management

## 2014-06-22 ENCOUNTER — Encounter: Payer: Self-pay | Admitting: Gastroenterology

## 2014-06-22 ENCOUNTER — Inpatient Hospital Stay (HOSPITAL_COMMUNITY): Payer: Medicare HMO

## 2014-06-22 ENCOUNTER — Telehealth: Payer: Self-pay | Admitting: Gastroenterology

## 2014-06-22 LAB — GLUCOSE, CAPILLARY
GLUCOSE-CAPILLARY: 129 mg/dL — AB (ref 70–99)
GLUCOSE-CAPILLARY: 158 mg/dL — AB (ref 70–99)
GLUCOSE-CAPILLARY: 165 mg/dL — AB (ref 70–99)
Glucose-Capillary: 174 mg/dL — ABNORMAL HIGH (ref 70–99)

## 2014-06-22 LAB — COMPREHENSIVE METABOLIC PANEL
ALBUMIN: 1.8 g/dL — AB (ref 3.5–5.2)
ALT: 14 U/L (ref 0–35)
ANION GAP: 7 (ref 5–15)
AST: 23 U/L (ref 0–37)
Alkaline Phosphatase: 43 U/L (ref 39–117)
BUN: 41 mg/dL — AB (ref 6–23)
CALCIUM: 7.8 mg/dL — AB (ref 8.4–10.5)
CO2: 25 mmol/L (ref 19–32)
CREATININE: 1.64 mg/dL — AB (ref 0.50–1.10)
Chloride: 109 mmol/L (ref 96–112)
GFR, EST AFRICAN AMERICAN: 33 mL/min — AB (ref >90)
GFR, EST NON AFRICAN AMERICAN: 28 mL/min — AB (ref >90)
Glucose, Bld: 129 mg/dL — ABNORMAL HIGH (ref 70–99)
Potassium: 3.2 mmol/L — ABNORMAL LOW (ref 3.5–5.1)
Sodium: 141 mmol/L (ref 135–145)
TOTAL PROTEIN: 4.9 g/dL — AB (ref 6.0–8.3)
Total Bilirubin: 1.5 mg/dL — ABNORMAL HIGH (ref 0.3–1.2)

## 2014-06-22 LAB — ALBUMIN, FLUID (OTHER)

## 2014-06-22 MED ORDER — LACTULOSE 10 GM/15ML PO SOLN
10.0000 g | Freq: Three times a day (TID) | ORAL | Status: DC
  Administered 2014-06-22 – 2014-06-23 (×5): 10 g via ORAL
  Filled 2014-06-22 (×5): qty 30

## 2014-06-22 NOTE — Progress Notes (Signed)
Patient alert oriented x 3 answers all questions appropriately. Rather weak frail voice no pain left hip ulcer as well as has no tissue breakdown just that of a hematoma she has aortic stenosis Elita BooneNash which produced cirrhosis which produced ascites and mild hyperammonemia now corrected she's hemodynamically stable at present. The decision is whether to continue care in skilled nursing facility or hospice at home patient expressed no preference when asked. She simply stated [place] will be done. She is not expected to improve significantly Inda CokeFrances M Butkiewicz [id number] DOB: June 25, 1929 DOA: 06/19/2014 PCP: Isabella StallingNDIEGO,Shamela Haydon M, MD             Physical Exam: Blood pressure 122/54, pulse 71, temperature 98.6 F (37 C), temperature source Oral, resp. rate 20, height 5\' 5"  (1.651 m), weight 198 lb 10.2 oz (90.1 kg), SpO2 100 %. no JVD no carotid bruits no thyromegaly lungs diminished breath sounds in the bases no rales wheeze or rhonchi appreciable heart regular rhythm 3/6 aortic Murmur No Heaves Thrills Rubs No S3-S4 Auscultated Soft Nontender Bowel Sounds Normoactive No Right Upper Quadrant Tenderness No Peristaltic Rushes   Investigations:  Recent Results (from the past 240 hour(s))  Wound culture     Status: None   Collection Time: 06/15/14  2:00 PM  Result Value Ref Range Status   Specimen Description WOUND LEFT THIGH  Final   Special Requests NONE  Final   Gram Stain   Final    RARE WBC PRESENT, PREDOMINANTLY PMN NO SQUAMOUS EPITHELIAL CELLS SEEN NO ORGANISMS SEEN Performed at [facility]    Culture   Final    MULTIPLE ORGANISMS PRESENT, NONE PREDOMINANT Note: NO STAPHYLOCOCCUS AUREUS ISOLATED NO GROUP A STREP (S.PYOGENES) ISOLATED Performed at [facility]    Report Status 06/18/2014 FINAL  Final  Culture, Urine     Status: None   Collection Time: 06/15/14  8:00 PM  Result Value Ref Range Status   Specimen Description URINE, CLEAN CATCH  Final   Special  Requests NONE  Final   Colony Count   Final    8,000 COLONIES/ML Performed at [facility]    Culture   Final    INSIGNIFICANT GROWTH Performed at [facility]    Report Status 06/17/2014 FINAL  Final  Urine culture     Status: None   Collection Time: 06/19/14 10:38 PM  Result Value Ref Range Status   Specimen Description URINE, CATHETERIZED  Final   Special Requests NONE  Final   Colony Count NO GROWTH Performed at [facility]   Final   Culture NO GROWTH Performed at [facility]   Final   Report Status 06/21/2014 FINAL  Final  Blood Culture (routine x 2)     Status: None (Preliminary result)   Collection Time: 06/19/14 11:30 PM  Result Value Ref Range Status   Specimen Description BLOOD LEFT ANTECUBITAL  Final   Special Requests BOTTLES DRAWN AEROBIC ONLY 6CC  Final   Culture NO GROWTH 2 DAYS  Final   Report Status PENDING  Incomplete  Blood Culture (routine x 2)     Status: None (Preliminary result)   Collection Time: 06/19/14 11:45 PM  Result Value Ref Range Status   Specimen Description BLOOD LEFT ANTECUBITAL  Final   Special Requests BOTTLES DRAWN AEROBIC ONLY 6CC  Final   Culture NO GROWTH 2 DAYS  Final   Report Status PENDING  Incomplete  MRSA PCR Screening     Status: None  Collection Time: 06/20/14  2:45 AM  Result Value Ref Range Status   MRSA by PCR NEGATIVE NEGATIVE Final    Comment:        The GeneXpert MRSA Assay (FDA approved for NASAL specimens only), is one component of a comprehensive MRSA colonization surveillance program. It is not intended to diagnose MRSA infection nor to guide or monitor treatment for MRSA infections.   Body fluid culture     Status: None (Preliminary result)   Collection Time: 06/20/14  3:20 PM  Result Value Ref Range Status   Specimen Description ASCITIC FLUID  Final   Special Requests NONE  Final   Gram Stain   Final    NO WBC SEEN NO ORGANISMS SEEN Performed at [facility]    Culture NO GROWTH Performed at [facility]   Final   Report Status PENDING  Incomplete     Basic Metabolic Panel:  Recent Labs  16/10/96 0640 06/21/14 0524  NA 139 142  K 4.1 3.8  CL 109 110  CO2 21 24  GLUCOSE 171* 128*  BUN 50* 46*  CREATININE 1.72* 1.73*  CALCIUM 8.3* 8.2*   Liver Function Tests:  Recent Labs  06/19/14 2254 06/20/14 0640  AST 35 27  ALT 18 15  ALKPHOS 60 45  BILITOT 2.6* 1.9*  1.8*  PROT 7.0 5.3*  ALBUMIN 2.6* 1.9*     CBC:  Recent Labs  06/19/14 2254 06/20/14 0640 06/21/14 0524  WBC 6.6 5.1 3.5*  NEUTROABS 5.5  --  2.2  HGB 10.6* 8.2* 8.5*  HCT 31.2* 24.5* 25.6*  MCV 94.8 93.5 95.5  PLT 115* 73* 72*    US Abdomen Complete  06/20/2014   CLINICAL DATA:  Cirrhosis, ascites  EXAM: ULTRASOUND ABDOMEN COMPLETE  COMPARISON:  CT 06/19/2014  FINDINGS: Gallbladder: Gallbladder is contracted. Shadowing noted in the gallbladder fossa, possibly shadowing gallstone. Negative sonographic Murphy's.  Common bile duct: Diameter: Normal caliber, 2 mm.  Liver: Nodular, shrunken liver compatible with cirrhosis. No focal abnormality. Large volume perihepatic ascites.  IVC: No abnormality visualized.  Pancreas: Pancreatic body and tail cannot be visualized due to overlying bowel gas.  Spleen: Size and appearance within normal limits.  Right Kidney: Length: 9.1 cm. Echogenicity within normal limits. No mass or hydronephrosis visualized.  Left Kidney: Length: 9.9 cm. Echogenicity within normal limits. No mass or hydronephrosis visualized.  Abdominal aorta: No aneurysm visualized.  Other findings: Moderate to large volume perihepatic and perisplenic ascites.  IMPRESSION: Changes of cirrhosis with associated ascites.  Contracted gallbladder with possible shadowing gallstone.   Electronically Signed   By: Charlett Nose M.D.   On: 06/20/2014 11:08   US Paracentesis  06/20/2014   CLINICAL DATA:  Ascites, diabetes, cirrhotic liver by ultrasound and  CT exams  EXAM: ULTRASOUND GUIDED DIAGNOSTIC AND THERAPEUTIC PARACENTESIS  COMPARISON:  None.  PROCEDURE: Procedure, benefits, and risks of procedure were discussed by the floor with the patient's son.  Informed telephone consent for procedure was obtained from the patient's son.  Time out protocol followed.  Adequate collection of ascites localized by ultrasound in RIGHT lower quadrant.  Skin prepped and draped in usual sterile fashion.  Skin and soft tissues anesthetized with 10 mL of 1% lidocaine.  5 [place] Yueh catheter placed into peritoneal cavity.  1800 mL of clear yellow fluid aspirated by vacuum bottle suction.  Procedure tolerated well by patient without immediate complication.  FINDINGS: A total of approximately 1800 mL of ascitic fluid was  removed. A fluid sample of 180 mL was sent for laboratory analysis.  IMPRESSION: Successful ultrasound guided paracentesis yielding 1800 mL of ascites.   Electronically Signed   By: Ulyses Southward M.D.   On: 06/20/2014 16:03      Medications:   Impression: Inability to swallow effectively  Principal Problem:   Altered mental status Active Problems:   Diabetes mellitus, type II   Atrial fibrillation   Chronic anticoagulation   Anemia   Generalized weakness   Thrombocytopenia   Elevated bilirubin   Edema   Sepsis   Hypothermia   Hyperammonemia   Cirrhosis   CKD (chronic kidney disease), stage III   Ascites   Hepatic encephalopathy     Plan: CMP today CBC and swallowing evaluation   Consultants: Gastroenterology   Procedures   Antibiotics:                   Code Status: No code no intubation  Family Communication:  Spoke with family at length yesterday  Disposition Plan we'll decide on him for Palliative care possibly at home with hospice or skilled nursing facility after swallowing eval today  Time spent: 40 minutes   LOS: 2 days   Delayne Sanzo M   06/22/2014, 7:08 AM

## 2014-06-22 NOTE — Progress Notes (Addendum)
Palliative Medicine consult received and appreciated. Case discussed in detail with CMRN and CSW and I reviewed her records in detail. Multiple poor prognostic factors including presentation with Hepatic Encephalopathy, Albumin of 1.8, auto anticoagulated and advanced age with ESLD. She is most appropriate for a Hospice Referral- it would be completely reasonable to focus purely on comfort and QOL- suspect she may not want to spend her remaining time rehab-ing at SNF. I would strongly support a discharge home with Hospice care. Patient was off the floor having SLP evaluation at time [facility]- I am happy to schedule a full family meeting if conflict over care plan occurs or if primary service needs assistance with prognostication and discussion about severity of illness with patient and other family members. [facility] if full goals of care discussion is needed or desired by patient and family.  Anderson MaltaElizabeth Candiss Galeana, DO Palliative Medicine [phone]

## 2014-06-22 NOTE — Telephone Encounter (Signed)
Please arrange routine hospital follow-up in 4-6 weeks. NASH cirrhosis, encephalopathy.

## 2014-06-22 NOTE — Progress Notes (Signed)
SPEECH PATHOLOGY  Radiology received order for DG swallow function, however the order was not linked to the SLP (MBSS) order, so order re-entered by myself to link the order to SLP so that test could be completed. Test will be completed at 1:00 PM today.  Thank you,  Havery MorosDabney Kerensa Nicklas, CCC-SLP [phone]

## 2014-06-22 NOTE — Clinical Social Work Note (Signed)
RNCM has met with patient and family and indicates plan is for dc home with hospice- CSW has notified [facility] and will follow up tomorrow for further dc planning and ?EMS transport.    Eduard Clos, MSW, [place]

## 2014-06-22 NOTE — Clinical Social Work Note (Addendum)
Rec'd message from [facility] that family did not "hold" bed. They are hopeful they will have a SNF bed available later in the week (Friday?) but cannot guarantee- CSW has left message for Gene (son) to advise of above situation and to prepare them to make a backup SNF option in case [facility] when she is medically ready for d/c.  CSW also contacted Aneta MinsPhillip, another son, and did speak with him- he states he thought they had held bed- suggested he follow up with Gene and [facility] to clarify- He also indicates that they are working to plan a family meeting for tomorrow to discuss plans further- he indicates his mother is not aware of her medical condition/prognosis at this time.  RNCM updated as well.  Tracey Heath, MSW, Theresia MajorsLCSWA [phone]

## 2014-06-22 NOTE — Progress Notes (Signed)
Subjective: More alert today. Knows she is in the hospital, knows the city. Tolerating liquids. No abdominal pain.   Objective: Vital signs in last 24 hours: Temp:  [98.4 F (36.9 C)-98.6 F (37 C)] 98.6 F (37 C) (03/23 0620) Pulse Rate:  [71-77] 71 (03/23 0620) Resp:  [18-20] 20 (03/23 0620) BP: (115-122)/(46-55) 122/54 mmHg (03/23 0620) SpO2:  [97 %-100 %] 100 % (03/23 0620) Weight:  [198 lb 10.2 oz (90.1 kg)] 198 lb 10.2 oz (90.1 kg) (03/23 0620) Last BM Date: 06/22/14 General:   Alert and oriented to person, place.  Head:  Normocephalic and atraumatic. Abdomen:  Bowel sounds present, soft, non-tender, non-distended. Umbilical hernia noted.  Extremities:  Much improved lower extremity edema, 1-2+ pitting edema to thigh  Neurologic:  Alert and  oriented to person and place only.    Intake/Output from previous day: 03/22 0701 - 03/23 0700 In: 120 [P.O.:120] Out: 650 [Urine:650] Intake/Output this shift:    Lab Results:  Recent Labs  06/19/14 2254 06/20/14 0640 06/21/14 0524  WBC 6.6 5.1 3.5*  HGB 10.6* 8.2* 8.5*  HCT 31.2* 24.5* 25.6*  PLT 115* 73* 72*   BMET  Recent Labs  06/20/14 0640 06/21/14 0524 06/22/14 0720  NA 139 142 141  K 4.1 3.8 3.2*  CL 109 110 109  CO2 GLUCOSE 171* 128* 129*  BUN 50* 46* 41*  CREATININE 1.72* 1.73* 1.64*  CALCIUM 8.3* 8.2* 7.8*   LFT  Recent Labs  06/19/14 2254 06/20/14 0640 06/22/14 0720  PROT 7.0 5.3* 4.9*  ALBUMIN 2.6* 1.9* 1.8*  AST 35 27 23  ALT ALKPHOS 60 45 43  BILITOT 2.6* 1.9*  1.8* 1.5*  BILIDIR  --  0.4  --   IBILI  --  1.5*  --    PT/INR  Recent Labs  06/20/14 0640  LABPROT 21.6*  INR 1.86*   Hepatitis Panel  Recent Labs  06/20/14 0640  HEPBSAG NEGATIVE  HCVAB NEGATIVE  HEPAIGM NON REACTIVE  HEPBIGM NON REACTIVE     Studies/Results: US Abdomen Complete  06/20/2014   CLINICAL DATA:  Cirrhosis, ascites  EXAM: ULTRASOUND ABDOMEN COMPLETE  COMPARISON:  CT  06/19/2014  FINDINGS: Gallbladder: Gallbladder is contracted. Shadowing noted in the gallbladder fossa, possibly shadowing gallstone. Negative sonographic Murphy's.  Common bile duct: Diameter: Normal caliber, 2 mm.  Liver: Nodular, shrunken liver compatible with cirrhosis. No focal abnormality. Large volume perihepatic ascites.  IVC: No abnormality visualized.  Pancreas: Pancreatic body and tail cannot be visualized due to overlying bowel gas.  Spleen: Size and appearance within normal limits.  Right Kidney: Length: 9.1 cm. Echogenicity within normal limits. No mass or hydronephrosis visualized.  Left Kidney: Length: 9.9 cm. Echogenicity within normal limits. No mass or hydronephrosis visualized.  Abdominal aorta: No aneurysm visualized.  Other findings: Moderate to large volume perihepatic and perisplenic ascites.  IMPRESSION: Changes of cirrhosis with associated ascites.  Contracted gallbladder with possible shadowing gallstone.   Electronically Signed   By: Charlett Nose M.D.   On: 06/20/2014 11:08   US Paracentesis  06/20/2014   CLINICAL DATA:  Ascites, diabetes, cirrhotic liver by ultrasound and CT exams  EXAM: ULTRASOUND GUIDED DIAGNOSTIC AND THERAPEUTIC PARACENTESIS  COMPARISON:  None.  PROCEDURE: Procedure, benefits, and risks of procedure were discussed by the floor with the patient's son.  Informed telephone consent for procedure was obtained from the patient's son.  Time out protocol followed.  Adequate collection of ascites  localized by ultrasound in RIGHT lower quadrant.  Skin prepped and draped in usual sterile fashion.  Skin and soft tissues anesthetized with 10 mL of 1% lidocaine.  5 [place] catheter placed into peritoneal cavity.  1800 mL of clear yellow fluid aspirated by vacuum bottle suction.  Procedure tolerated well by patient without immediate complication.  FINDINGS: A total of approximately 1800 mL of ascitic fluid was removed. A fluid sample of 180 mL was sent for laboratory  analysis.  IMPRESSION: Successful ultrasound guided paracentesis yielding 1800 mL of ascites.   Electronically Signed   By: Ulyses SouthwardMark  Boles M.D.   On: 06/20/2014 16:03    Assessment: 81 year old female with multiple comorbidities, admitted with sepsis, altered mental status, decompensated cirrhosis (MELD 21), found to have mildly elevated ammonia at 87, mildly elevated bilirubin with indirect component but normal transaminases. Elevated bilirubin likely secondary to Gilbert's. Paracentesis with 1.8 liters removed but no evidence of SBP. Blood cultures without growth thus far. Cirrhosis etiology unknown but likely secondary to underlying NASH. Would hold on extensive work-up for cirrhosis unless bump in LFTs. Significant clinical improvement since admission. Now awake and alert to person and place. Tolerating sips of liquids and medications.   If patient were to significantly improve clinically, could consider EGD for assessment of known varices. Hepatic encephalopathy likely multifactorial in setting of sepsis. Will transition lactulose to oral dosing.   Anemia: with normal iron, ferritin. Likely chronic disease. Heme positive stool but NO evidence of overt GI bleeding. Continue to monitor.   Plan: Change lactulose enemas to oral dosing Follow-up on final cytology from fluid analysis Consider EGD as outpatient for variceal assessment if clinically appropriate   Nira RetortAnna W. Sams, ANP-BC [facility]    LOS: 2 days    06/22/2014, 10:17 AM   Attending note:  Patient seen and examined. Agree with above assessment and recommendations.

## 2014-06-22 NOTE — Telephone Encounter (Signed)
APPOINTMENT MADE AND LETTER MAILED  °

## 2014-06-22 NOTE — Progress Notes (Signed)
PT Cancellation Note  Patient Details Name: Tracey Heath MRN: [id number] DOB: 10/01/1929   Cancelled Treatment:    Reason Eval/Treat Not Completed: Patient at procedure or test/unavailable   Myrlene BrokerBrown, Odis Turck L 06/22/2014, 1:41 PM
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
## 2014-06-23 LAB — GLUCOSE, CAPILLARY
Glucose-Capillary: 132 mg/dL — ABNORMAL HIGH (ref 70–99)
Glucose-Capillary: 151 mg/dL — ABNORMAL HIGH (ref 70–99)
Glucose-Capillary: 188 mg/dL — ABNORMAL HIGH (ref 70–99)

## 2014-06-23 NOTE — Discharge Summary (Signed)
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 subluxation, or dislocation. Soft tissues are intact.  IMPRESSION: No acute bony abnormality.   Electronically Signed   By: Charlett Nose M.D.   On: 06/06/2014 16:59    Microbiology: Recent Results (from the past 240 hour(s))  Wound culture     Status: None   Collection Time: 06/15/14  2:00 PM  Result Value Ref Range Status   Specimen Description WOUND LEFT THIGH  Final   Special Requests NONE  Final   Gram Stain   Final    RARE WBC PRESENT, PREDOMINANTLY PMN NO SQUAMOUS EPITHELIAL CELLS SEEN NO ORGANISMS SEEN Performed at [facility]    Culture   Final    MULTIPLE ORGANISMS PRESENT, NONE PREDOMINANT Note: NO STAPHYLOCOCCUS AUREUS ISOLATED NO GROUP A STREP (S.PYOGENES) ISOLATED Performed at [facility]    Report Status 06/18/2014 FINAL  Final  Culture, Urine     Status: None   Collection Time: 06/15/14  8:00 PM  Result Value Ref Range Status   Specimen Description URINE, CLEAN CATCH  Final   Special Requests NONE  Final   Colony Count   Final    8,000 COLONIES/ML Performed at [facility]   Final  INSIGNIFICANT GROWTH Performed at [facility]    Report Status 06/17/2014 FINAL  Final  Urine culture     Status: None   Collection Time:  06/19/14 10:38 PM  Result Value Ref Range Status   Specimen Description URINE, CATHETERIZED  Final   Special Requests NONE  Final   Colony Count NO GROWTH Performed at [facility]   Final   Culture NO GROWTH Performed at [facility]   Final   Report Status 06/21/2014 FINAL  Final  Blood Culture (routine x 2)     Status: None (Preliminary result)   Collection Time: 06/19/14 11:30 PM  Result Value Ref Range Status   Specimen Description BLOOD LEFT ANTECUBITAL  Final   Special Requests BOTTLES DRAWN AEROBIC ONLY 6CC  Final   Culture NO GROWTH 3 DAYS  Final   Report Status PENDING  Incomplete  Blood Culture (routine x 2)     Status: None (Preliminary result)   Collection Time: 06/19/14 11:45 PM  Result Value Ref Range Status   Specimen Description BLOOD LEFT ANTECUBITAL  Final   Special Requests BOTTLES DRAWN AEROBIC ONLY 6CC  Final   Culture NO GROWTH 3 DAYS  Final   Report Status PENDING  Incomplete  MRSA PCR Screening     Status: None   Collection Time: 06/20/14  2:45 AM  Result Value Ref Range Status   MRSA by PCR NEGATIVE NEGATIVE Final    Comment:        The GeneXpert MRSA Assay (FDA approved for NASAL specimens only), is one component of a comprehensive MRSA colonization surveillance program. It is not intended to diagnose MRSA infection nor to guide or monitor treatment for MRSA infections.   Body fluid culture     Status: None (Preliminary result)   Collection Time: 06/20/14  3:20 PM  Result Value Ref Range Status   Specimen Description ASCITIC FLUID  Final   Special Requests NONE  Final   Gram Stain   Final    NO WBC SEEN NO ORGANISMS SEEN Performed at [facility]    Culture   Final    NO GROWTH 1 DAY Performed at [facility]    Report Status PENDING  Incomplete     Labs: Basic Metabolic Panel:  Recent Labs Lab 06/18/14 0400 06/19/14 2254 06/20/14 0640 06/21/14 0524 06/22/14 0720  NA 138 139 139 142 141  K  4.5 4.9 4.1 3.8 3.2*  CL 110 105 109 110 109  CO2 21 21 21 24 25   GLUCOSE 140* 178* 171* 128* 129*  BUN 46* 51* 50* 46* 41*  CREATININE 1.69* 1.74* 1.72* 1.73* 1.64*  CALCIUM 8.7 9.0 8.3* 8.2* 7.8*   Liver Function Tests:  Recent Labs Lab 06/19/14 2254 06/20/14 0640 06/22/14 0720  AST 35 27 23  ALT 18 15 14   ALKPHOS 60 45 43  BILITOT 2.6* 1.9*  1.8* 1.5*  PROT 7.0 5.3* 4.9*  ALBUMIN 2.6* 1.9* 1.8*   No results for input(s): LIPASE, AMYLASE in the last 168 hours.  Recent Labs Lab 06/19/14 2254 06/20/14 0640 06/21/14 0524  AMMONIA 87* 53* 22   CBC:  Recent Labs Lab 06/16/14 0710 06/18/14 0400 06/19/14 2254 06/20/14 0640 06/21/14 0524  WBC 3.3* 3.3* 6.6 5.1 3.5*  NEUTROABS  --  2.1 5.5  --  2.2  HGB 9.2* 8.8* 10.6* 8.2* 8.5*  HCT 27.3* 26.5* 31.2* 24.5* 25.6*  MCV 93.8 95.3 94.8 93.5 95.5  PLT 78* 72* 115*  73* 72*   Cardiac Enzymes:  Recent Labs Lab 06/19/14 2254  TROPONINI <0.03   BNP: BNP (last 3 results)  Recent Labs  06/06/14 2127 06/16/14 0710  BNP 623.0* 662.0*    ProBNP (last 3 results) No results for input(s): PROBNP in the last 8760 hours.  CBG:  Recent Labs Lab 06/21/14 2046 06/22/14 0812 06/22/14 1203 06/22/14 1634 06/22/14 2224  GLUCAP 136* 129* 158* 165* 174*       Signed:  Rainy Rothman M  [facility] Pager: [phone] 06/23/2014, 7:06 AM

## 2014-06-23 NOTE — Clinical Social Work Note (Signed)
Pt d/c today home with hospice. EMS form completed. CSW spoke with pt's son who verified address and that family would be present upon arrival. Family will notify staff when they are ready for transport. Family also asking about advance directives. Chaplain already aware of request.   Derenda FennelKara Ahkeem Goede, LCSW [phone]

## 2014-06-23 NOTE — Progress Notes (Addendum)
Tracey CokeFrances M Heath discharged home with hospice care and family per MD order.  Discharge instructions reviewed and discussed with the patient's grand daughter [name], all questions and concerns answered. Copy of instructions and scripts given to patient's grand daughter [name].    Medication List    STOP taking these medications        acetaminophen 325 MG tablet  Commonly known as:  TYLENOL     acidophilus Caps capsule     alendronate 70 MG tablet  Commonly known as:  FOSAMAX     cholecalciferol 1000 UNITS tablet  Commonly known as:  VITAMIN D     doxycycline 100 MG tablet  Commonly known as:  VIBRA-TABS     Fish Oil 1200 MG Caps     ICAPS Tabs     metFORMIN 500 MG tablet  Commonly known as:  GLUCOPHAGE     potassium chloride SA 20 MEQ tablet  Commonly known as:  K-DUR,KLOR-CON     promethazine 12.5 MG suppository  Commonly known as:  PHENERGAN      TAKE these medications        albuterol-ipratropium 18-103 MCG/ACT inhaler  Commonly known as:  COMBIVENT  Inhale 2 puffs into the lungs every 4 (four) hours as needed for wheezing.     aspirin EC 81 MG tablet  Take 81 mg by mouth daily.     bisacodyl 5 MG EC tablet  Commonly known as:  DULCOLAX  Take 1 tablet (5 mg total) by mouth 2 (two) times daily as needed for mild constipation or moderate constipation.     brimonidine 0.15 % ophthalmic solution  Commonly known as:  ALPHAGAN  Place 1 drop into both eyes 2 (two) times daily.     collagenase ointment  Commonly known as:  SANTYL  Apply 1 application topically daily. To left lateral thigh change every day     dorzolamide-timolol 22.3-6.8 MG/ML ophthalmic solution  Commonly known as:  COSOPT  Place 1 drop into both eyes 2 (two) times daily.     feeding supplement (PRO-STAT SUGAR FREE 64) Liqd  Take 30 mLs by mouth 2 (two) times daily between meals. Discontinue when wound is resolved.     furosemide 20 MG tablet  Commonly known as:  LASIX  Take 1 tablet (20 mg  total) by mouth daily.     latanoprost 0.005 % ophthalmic solution  Commonly known as:  XALATAN  Place 1 drop into both eyes at bedtime.     levothyroxine 50 MCG tablet  Commonly known as:  SYNTHROID, LEVOTHROID  Take 50 mcg by mouth daily before breakfast.     metoprolol tartrate 25 MG tablet  Commonly known as:  LOPRESSOR  Take 25 mg by mouth 2 (two) times daily.     polyethylene glycol packet  Commonly known as:  MIRALAX / GLYCOLAX  Take 17 g by mouth daily.     sodium phosphate Pediatric 3.5-9.5 GM/59ML enema  Place 1 enema rectally once as needed (Constipation).       Foley catheter left in place and hospice nurse was made aware.  IV site discontinued and catheter remains intact. Site without signs and symptoms of complications. Dressing and pressure applied.  Patient will be escorted by EMS via stretcher. EMS was notified and EMS is aware and will be here as soon as they can. Family was notified.   [name], [name] 06/23/2014 1:50 PM

## 2014-06-24 LAB — BODY FLUID CULTURE
CULTURE: NO GROWTH
GRAM STAIN: NONE SEEN

## 2014-06-24 NOTE — Care Management Utilization Note (Signed)
UR completed 

## 2014-06-25 ENCOUNTER — Encounter (HOSPITAL_COMMUNITY)
Admission: RE | Admit: 2014-06-25 | Discharge: 2014-06-25 | Disposition: A | Discharge status: Home / Self Care | Payer: Medicare HMO | Source: Skilled Nursing Facility | Attending: Internal Medicine | Admitting: Internal Medicine

## 2014-06-25 LAB — CULTURE, BLOOD (ROUTINE X 2)
CULTURE: NO GROWTH
Culture: NO GROWTH

## 2014-06-29 ENCOUNTER — Other Ambulatory Visit (HOSPITAL_COMMUNITY): Payer: Self-pay | Admitting: Hematology & Oncology

## 2014-07-05 ENCOUNTER — Ambulatory Visit (HOSPITAL_COMMUNITY): Payer: Medicare HMO | Admitting: Hematology & Oncology

## 2014-07-08 ENCOUNTER — Encounter (HOSPITAL_COMMUNITY): Payer: Self-pay

## 2014-07-31 DEATH — deceased

## 2014-08-04 ENCOUNTER — Encounter: Payer: Self-pay | Admitting: Gastroenterology

## 2014-08-04 ENCOUNTER — Ambulatory Visit: Payer: Medicare HMO | Admitting: Gastroenterology

## 2014-08-04 ENCOUNTER — Telehealth: Payer: Self-pay | Admitting: Gastroenterology

## 2014-08-04 NOTE — Telephone Encounter (Signed)
PATIENT WAS A NO SHOW 08/04/14 AND LETTER WAS SENT °

## 2016-09-07 IMAGING — CT CT HEAD W/O CM
1 series · 15 of 30 positions shown, 19 images · non-contrast
Comparison: Prior CT scan of the head and cervical spine 12/13/2011

CLINICAL DATA: 84-year-old female with weakness

EXAM:
CT HEAD WITHOUT CONTRAST
TECHNIQUE: Contiguous axial images were obtained from the base of the skull
through the vertex without intravenous contrast.

[Series 2: headtrauma 4.8 h37s · axial · 0.50mm/px · z∈[+89,+226]mm · 15 of 30 slices shown, 19 images]
[im 2/30  brain]
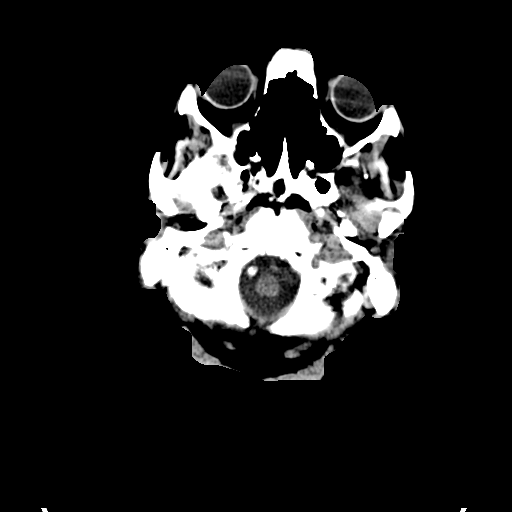
[im 2/30  bone]
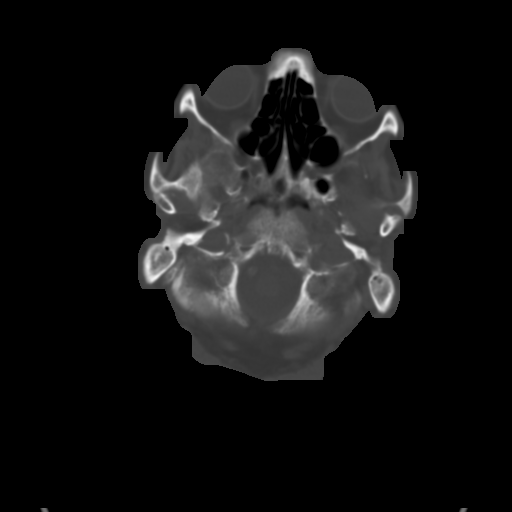
[im 4/30  brain]
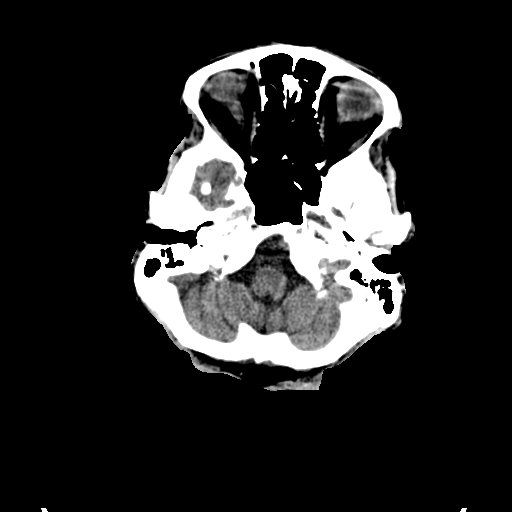
[im 6/30  brain]
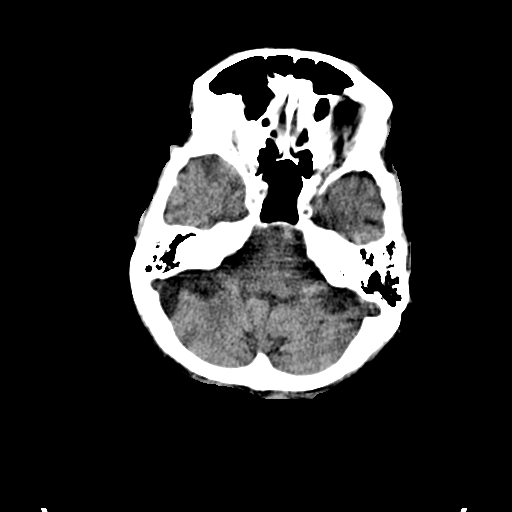
[im 8/30  brain]
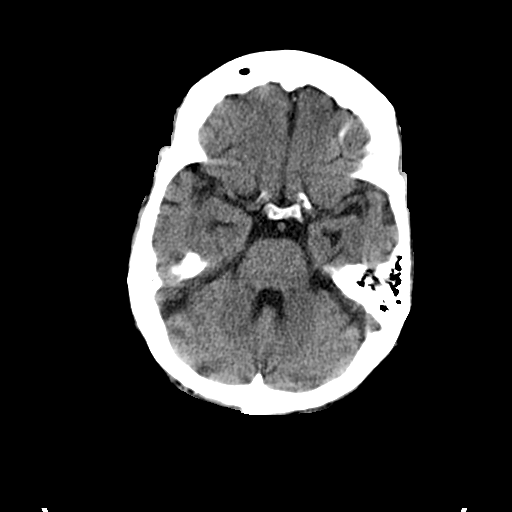
[im 10/30  brain]
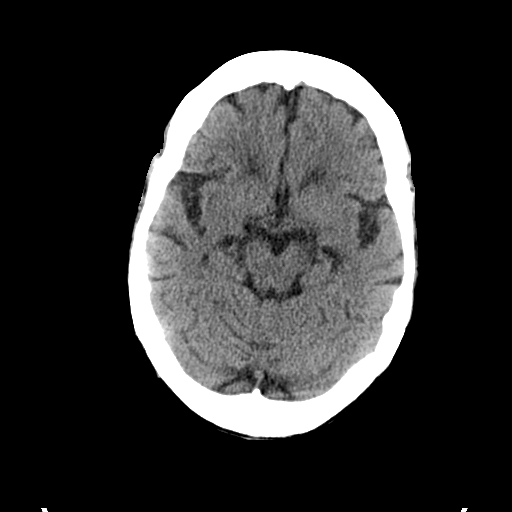
[im 10/30  bone]
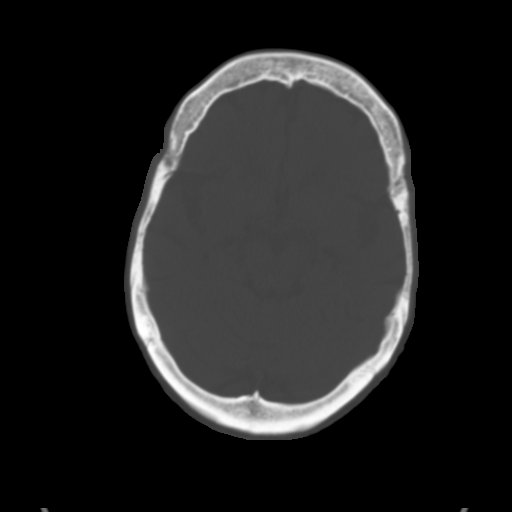
[im 12/30  brain]
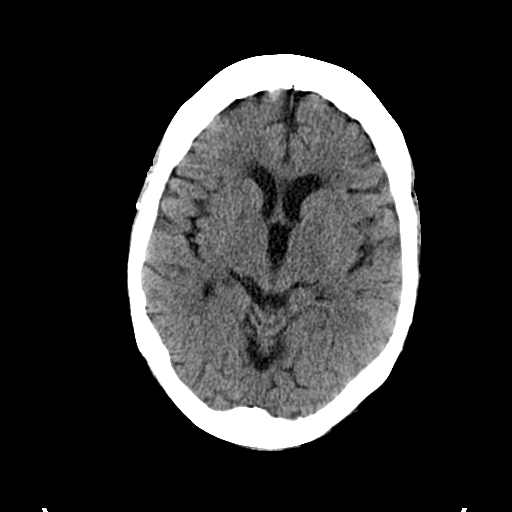
[im 14/30  brain]
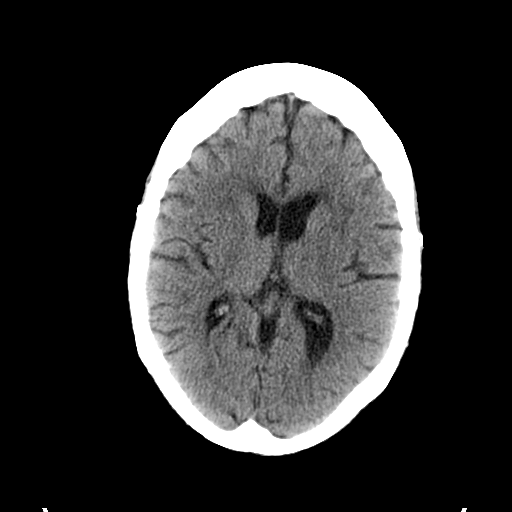
[im 16/30  brain]
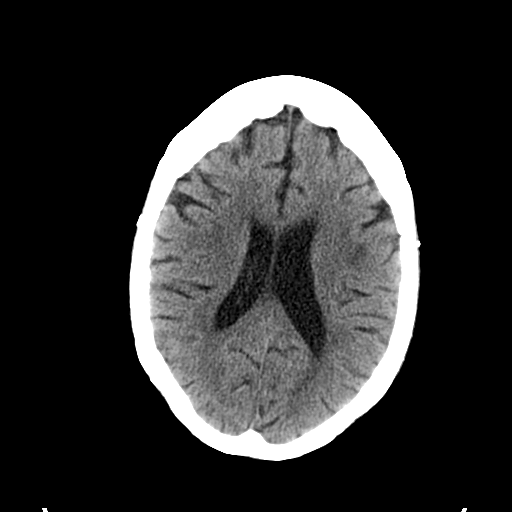
[im 17/30  brain]
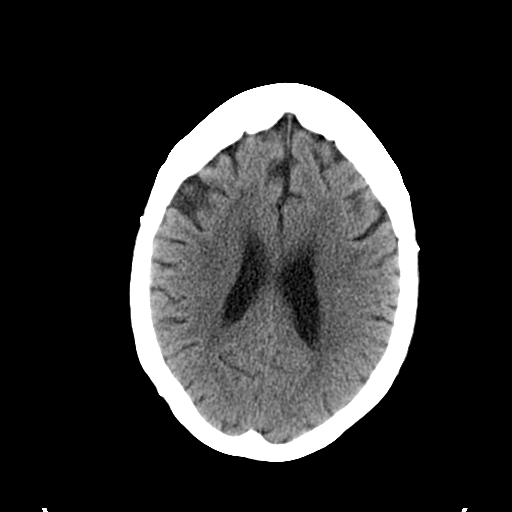
[im 17/30  bone]
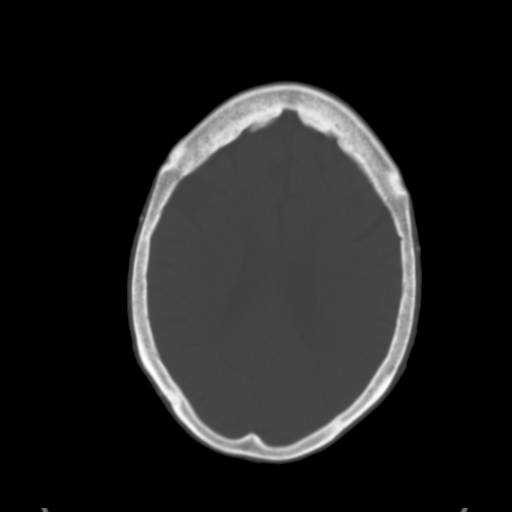
[im 19/30  brain]
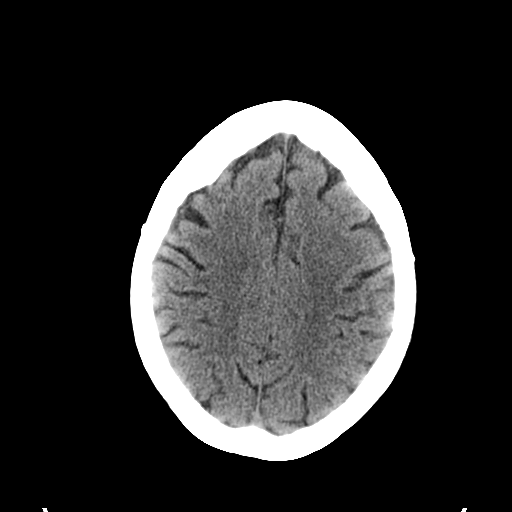
[im 21/30  brain]
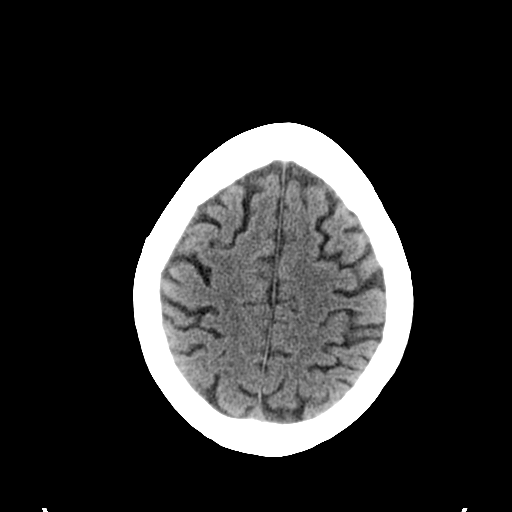
[im 23/30  brain]
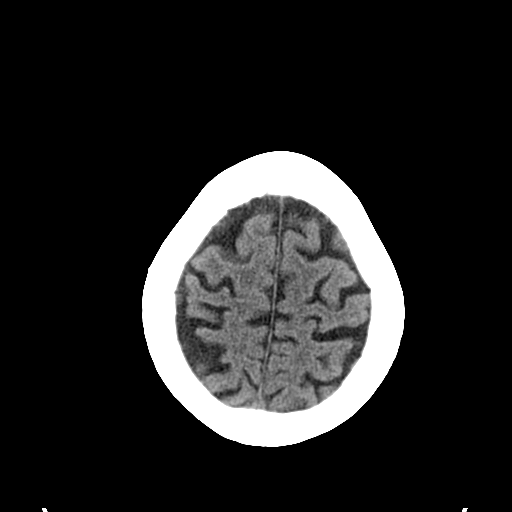
[im 25/30  brain]
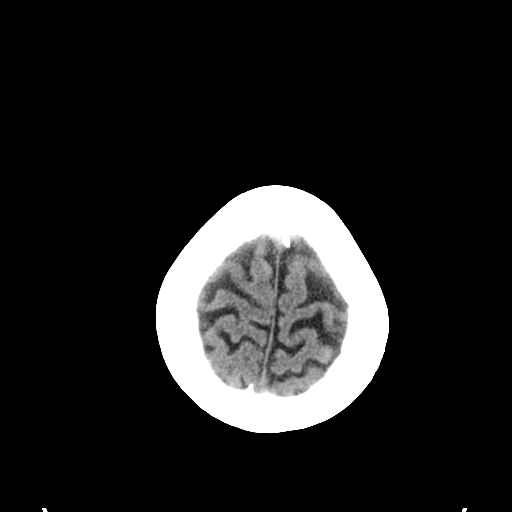
[im 25/30  bone]
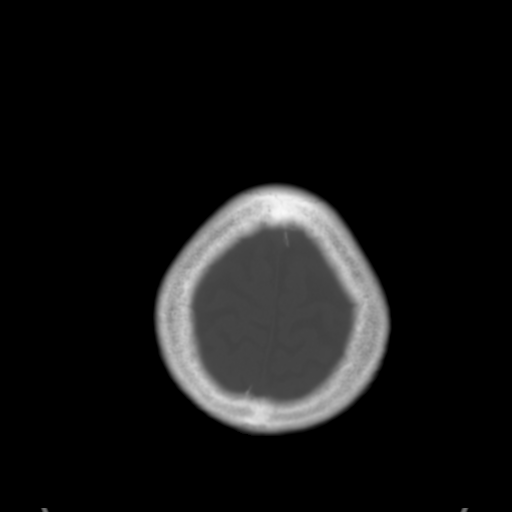
[im 27/30  brain]
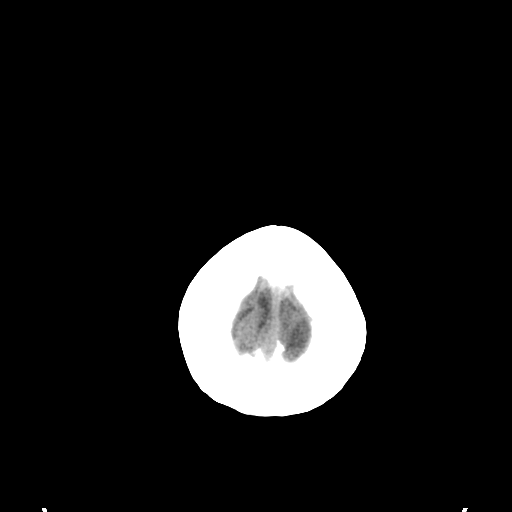
[im 29/30  brain]
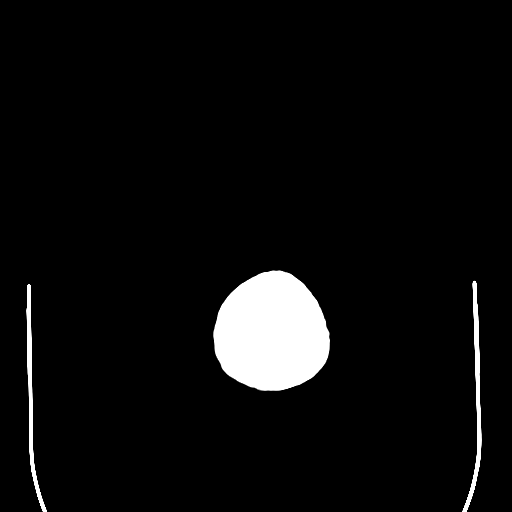

[15 of 30 positions shown; findings below may reference images not displayed]

FINDINGS: Negative for acute intracranial hemorrhage, acute infarction, mass,
mass effect, hydrocephalus or midline shift. Gray-white
differentiation is preserved throughout. Relatively mild cortical
atrophy for age. Mild periventricular and and subcortical white
matter hypoattenuation consistent with chronic microvascular
ischemic white matter disease is also similar in unchanged compared
to prior. No focal soft tissue or calvarial abnormality. Bilateral
globes and orbits are symmetric bilaterally. Normal aeration of the
mastoid air cells and visualized paranasal sinuses. Atherosclerotic
calcification in both cavernous carotid arteries. Hyperostosis
frontalis interna noted incidentally. Stable small lucency in the
right parieto-occipital calvarium dating back to 4851 and therefore
likely benign.
IMPRESSION: 1. No acute intracranial abnormality.
2. Stable mild atrophy and chronic microvascular ischemic white
matter disease.

## 2016-09-07 IMAGING — DX DG HIP (WITH OR WITHOUT PELVIS) 3-4V BILAT
5 series · 5 of 5 positions shown · non-contrast
Comparison: None.

CLINICAL DATA: Multiple falls.  Bilateral hip tenderness.

EXAM:
BILATERAL HIP (WITH PELVIS) 3-4 VIEWS

[hip ap (1 of 2)]
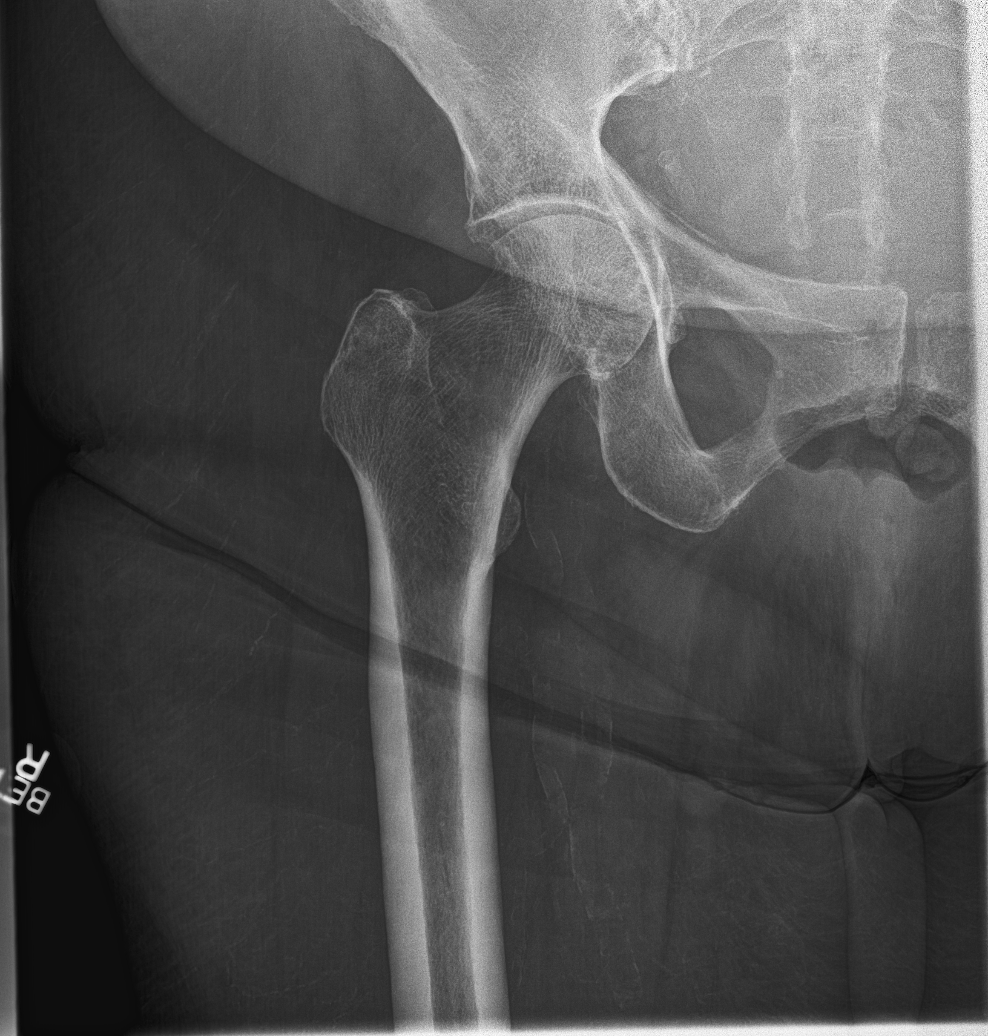

[hip ap (2 of 2)]
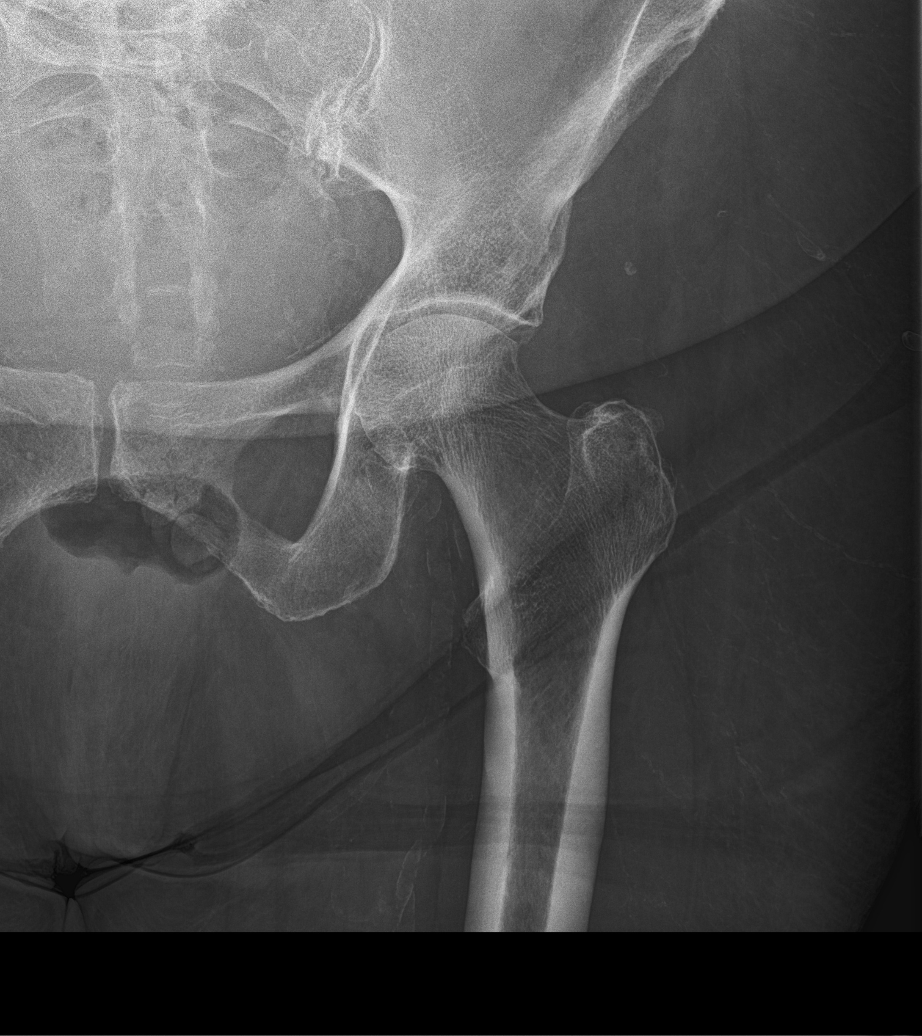

[hip lat (1 of 2)]
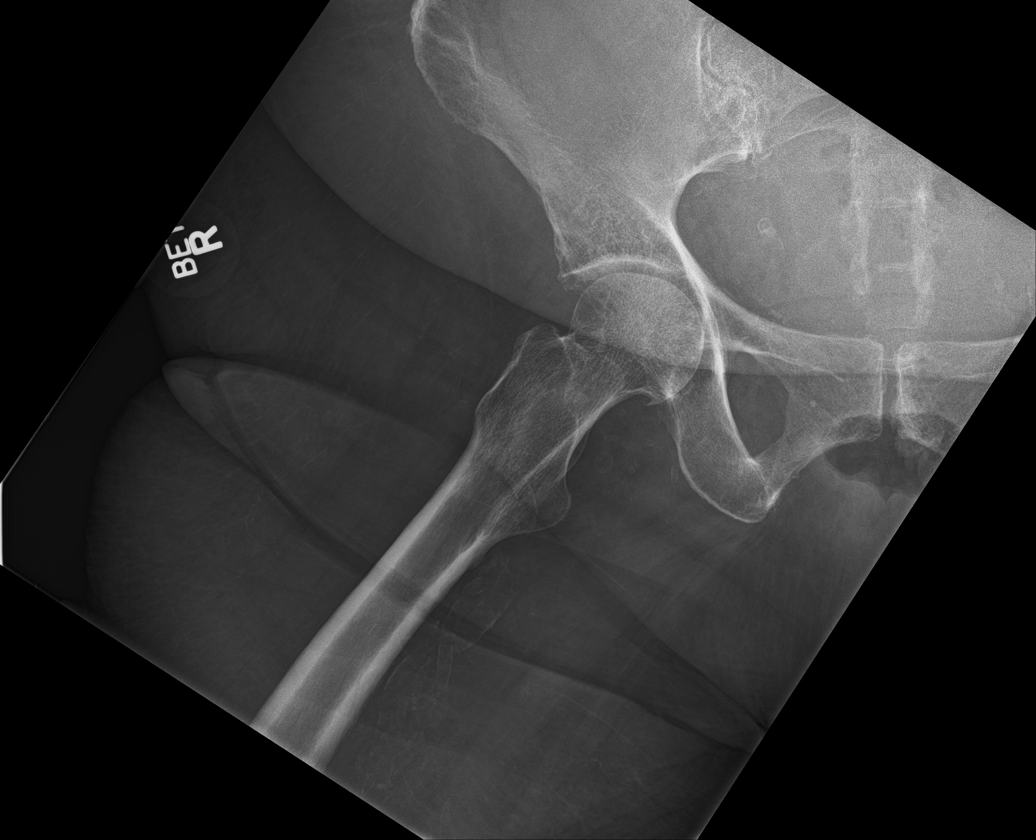

[hip lat (2 of 2)]
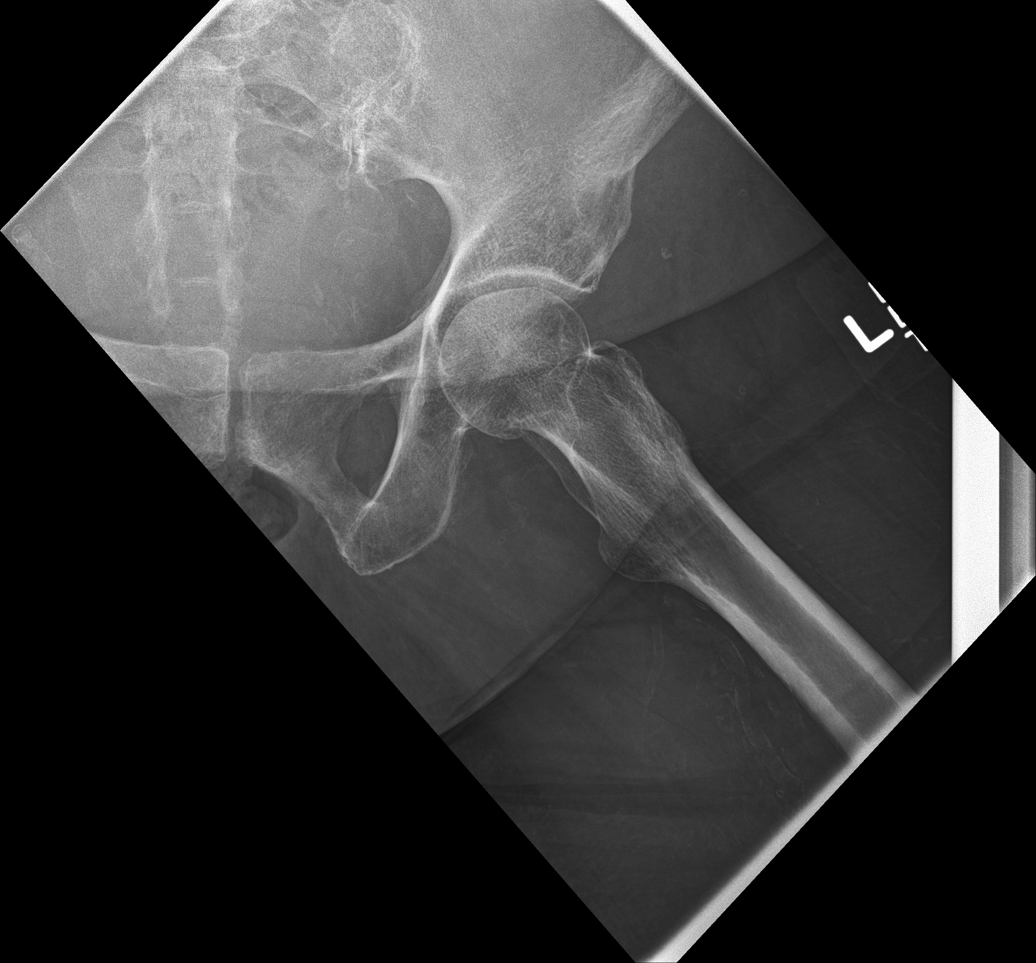

[pelvis ap]
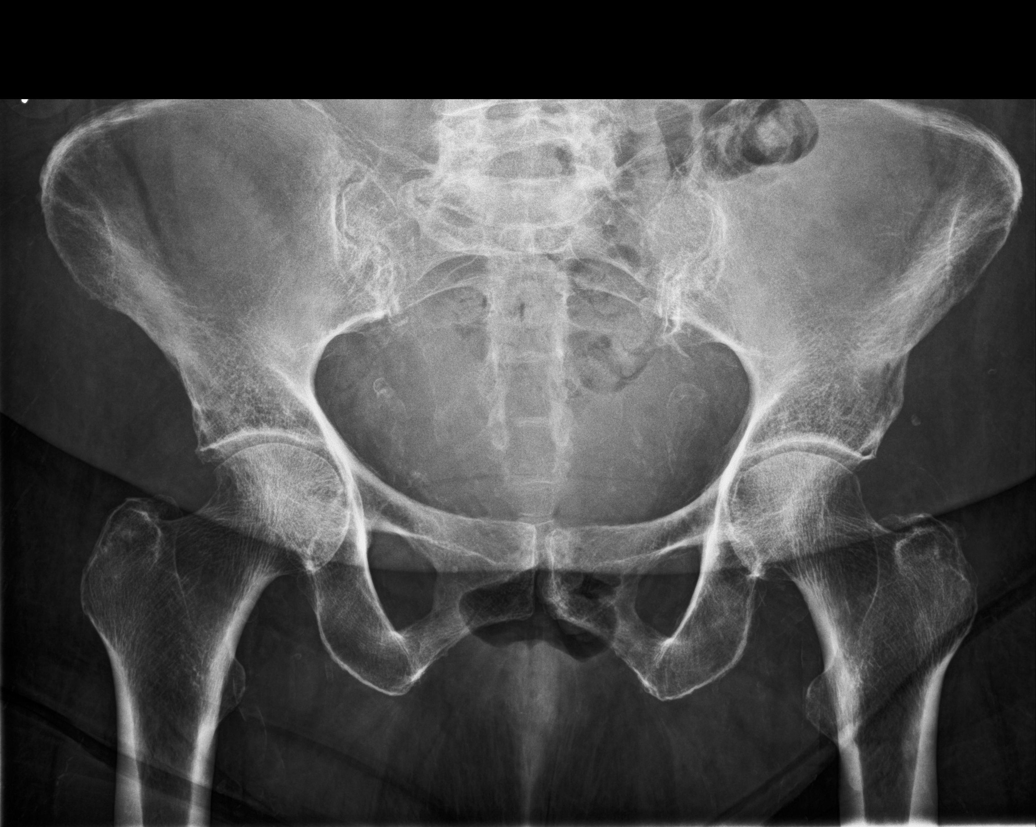

[5 of 5 positions shown; findings below may reference images not displayed]

FINDINGS: Mild symmetric degenerative changes in the hips bilaterally. No
acute bony abnormality. Specifically, no fracture, subluxation, or
dislocation. Soft tissues are intact.
IMPRESSION: No acute bony abnormality.
